# Patient Record
Sex: Male | Born: 1950
Health system: Southern US, Community
[De-identification: ages and names within clinical notes are randomized; demographics above are authoritative.]

---

## 2019-06-23 DIAGNOSIS — Z20828 Contact with and (suspected) exposure to other viral communicable diseases: Secondary | ICD-10-CM | POA: Diagnosis not present

## 2019-09-27 ENCOUNTER — Other Ambulatory Visit: Payer: Self-pay

## 2019-09-27 ENCOUNTER — Encounter (HOSPITAL_COMMUNITY): Payer: Self-pay

## 2019-09-27 ENCOUNTER — Emergency Department (HOSPITAL_COMMUNITY): Payer: Federal, State, Local not specified - PPO

## 2019-09-27 ENCOUNTER — Emergency Department (HOSPITAL_BASED_OUTPATIENT_CLINIC_OR_DEPARTMENT_OTHER)
Admit: 2019-09-27 | Discharge: 2019-09-27 | Disposition: A | Discharge status: Home / Self Care | Payer: Federal, State, Local not specified - PPO | Attending: Emergency Medicine | Admitting: Emergency Medicine

## 2019-09-27 ENCOUNTER — Inpatient Hospital Stay (HOSPITAL_COMMUNITY)
Admission: EM | Admit: 2019-09-27 | Discharge: 2019-10-02 | DRG: 252 | Disposition: A | Discharge status: Home / Self Care | Payer: Federal, State, Local not specified - PPO | Attending: Family Medicine | Admitting: Family Medicine

## 2019-09-27 ENCOUNTER — Inpatient Hospital Stay (HOSPITAL_COMMUNITY): Payer: Federal, State, Local not specified - PPO

## 2019-09-27 DIAGNOSIS — I82401 Acute embolism and thrombosis of unspecified deep veins of right lower extremity: Secondary | ICD-10-CM

## 2019-09-27 DIAGNOSIS — E871 Hypo-osmolality and hyponatremia: Secondary | ICD-10-CM | POA: Diagnosis not present

## 2019-09-27 DIAGNOSIS — I82421 Acute embolism and thrombosis of right iliac vein: Principal | ICD-10-CM | POA: Diagnosis present

## 2019-09-27 DIAGNOSIS — Z20822 Contact with and (suspected) exposure to covid-19: Secondary | ICD-10-CM | POA: Diagnosis not present

## 2019-09-27 DIAGNOSIS — M7989 Other specified soft tissue disorders: Secondary | ICD-10-CM

## 2019-09-27 DIAGNOSIS — D696 Thrombocytopenia, unspecified: Secondary | ICD-10-CM | POA: Diagnosis not present

## 2019-09-27 DIAGNOSIS — R0602 Shortness of breath: Secondary | ICD-10-CM | POA: Diagnosis not present

## 2019-09-27 DIAGNOSIS — I82411 Acute embolism and thrombosis of right femoral vein: Secondary | ICD-10-CM | POA: Diagnosis not present

## 2019-09-27 DIAGNOSIS — I10 Essential (primary) hypertension: Secondary | ICD-10-CM | POA: Diagnosis present

## 2019-09-27 DIAGNOSIS — I2694 Multiple subsegmental pulmonary emboli without acute cor pulmonale: Secondary | ICD-10-CM | POA: Diagnosis not present

## 2019-09-27 DIAGNOSIS — R17 Unspecified jaundice: Secondary | ICD-10-CM | POA: Diagnosis not present

## 2019-09-27 DIAGNOSIS — I824Y1 Acute embolism and thrombosis of unspecified deep veins of right proximal lower extremity: Secondary | ICD-10-CM

## 2019-09-27 DIAGNOSIS — Z79899 Other long term (current) drug therapy: Secondary | ICD-10-CM | POA: Diagnosis not present

## 2019-09-27 DIAGNOSIS — I2699 Other pulmonary embolism without acute cor pulmonale: Secondary | ICD-10-CM | POA: Diagnosis not present

## 2019-09-27 DIAGNOSIS — I82409 Acute embolism and thrombosis of unspecified deep veins of unspecified lower extremity: Secondary | ICD-10-CM

## 2019-09-27 DIAGNOSIS — N179 Acute kidney failure, unspecified: Secondary | ICD-10-CM

## 2019-09-27 LAB — TROPONIN I (HIGH SENSITIVITY)
Troponin I (High Sensitivity): 5 ng/L (ref <18)
Troponin I (High Sensitivity): 4 ng/L (ref <18)

## 2019-09-27 LAB — CBC WITH DIFFERENTIAL/PLATELET
Abs Immature Granulocytes: 0.03 10*3/uL (ref 0.00–0.07)
Basophils Absolute: 0.1 10*3/uL (ref 0.0–0.1)
Basophils Relative: 1 %
Eosinophils Absolute: 0.2 10*3/uL (ref 0.0–0.5)
Eosinophils Relative: 2 %
HCT: 48.2 % (ref 39.0–52.0)
Hemoglobin: 16 g/dL (ref 13.0–17.0)
Immature Granulocytes: 0 %
Lymphocytes Relative: 14 %
Lymphs Abs: 1.1 10*3/uL (ref 0.7–4.0)
MCH: 32.2 pg (ref 26.0–34.0)
MCHC: 33.2 g/dL (ref 30.0–36.0)
MCV: 97 fL (ref 80.0–100.0)
Monocytes Absolute: 0.5 10*3/uL (ref 0.1–1.0)
Monocytes Relative: 7 %
Neutro Abs: 6 10*3/uL (ref 1.7–7.7)
Neutrophils Relative %: 76 %
Platelets: 108 10*3/uL — ABNORMAL LOW (ref 150–400)
RBC: 4.97 MIL/uL (ref 4.22–5.81)
RDW: 12.6 % (ref 11.5–15.5)
WBC: 7.9 10*3/uL (ref 4.0–10.5)
nRBC: 0 % (ref 0.0–0.2)

## 2019-09-27 LAB — COMPREHENSIVE METABOLIC PANEL
ALT: 39 U/L (ref 0–44)
AST: 36 U/L (ref 15–41)
Albumin: 4.5 g/dL (ref 3.5–5.0)
Alkaline Phosphatase: 68 U/L (ref 38–126)
Anion gap: 12 (ref 5–15)
BUN: 30 mg/dL — ABNORMAL HIGH (ref 8–23)
CO2: 21 mmol/L — ABNORMAL LOW (ref 22–32)
Calcium: 9.4 mg/dL (ref 8.9–10.3)
Chloride: 101 mmol/L (ref 98–111)
Creatinine, Ser: 1.42 mg/dL — ABNORMAL HIGH (ref 0.61–1.24)
GFR calc Af Amer: 58 mL/min — ABNORMAL LOW (ref >60)
GFR calc non Af Amer: 50 mL/min — ABNORMAL LOW (ref >60)
Glucose, Bld: 136 mg/dL — ABNORMAL HIGH (ref 70–99)
Potassium: 4.6 mmol/L (ref 3.5–5.1)
Sodium: 134 mmol/L — ABNORMAL LOW (ref 135–145)
Total Bilirubin: 1.8 mg/dL — ABNORMAL HIGH (ref 0.3–1.2)
Total Protein: 8.2 g/dL — ABNORMAL HIGH (ref 6.5–8.1)

## 2019-09-27 LAB — RESPIRATORY PANEL BY RT PCR (FLU A&B, COVID)
Influenza A by PCR: NEGATIVE
Influenza B by PCR: NEGATIVE
SARS Coronavirus 2 by RT PCR: NEGATIVE

## 2019-09-27 LAB — HEPARIN LEVEL (UNFRACTIONATED): Heparin Unfractionated: 0.81 IU/mL — ABNORMAL HIGH (ref 0.30–0.70)

## 2019-09-27 LAB — BRAIN NATRIURETIC PEPTIDE: B Natriuretic Peptide: 28.3 pg/mL (ref 0.0–100.0)

## 2019-09-27 LAB — HIV ANTIBODY (ROUTINE TESTING W REFLEX): HIV Screen 4th Generation wRfx: NONREACTIVE

## 2019-09-27 MED ORDER — ONDANSETRON HCL 4 MG/2ML IJ SOLN: 4.0000 mg | Freq: Four times a day (QID) | INTRAMUSCULAR | Status: DC | PRN

## 2019-09-27 MED ORDER — HEPARIN (PORCINE) 25000 UT/250ML-% IV SOLN
1300.0000 [IU]/h | INTRAVENOUS | Status: AC
  Administered 2019-09-27: 13:00:00 1300 [IU]/h via INTRAVENOUS
  Administered 2019-09-28 – 2019-09-29 (×2): 1000 [IU]/h via INTRAVENOUS
  Administered 2019-09-30: 1200 [IU]/h via INTRAVENOUS
  Administered 2019-10-01: 1300 [IU]/h via INTRAVENOUS
  Administered 2019-10-01: 1200 [IU]/h via INTRAVENOUS
  Filled 2019-09-27 (×6): qty 250

## 2019-09-27 MED ORDER — HEPARIN BOLUS VIA INFUSION
5000.0000 [IU] | Freq: Once | INTRAVENOUS | Status: AC
  Administered 2019-09-27: 5000 [IU] via INTRAVENOUS
  Filled 2019-09-27: qty 5000

## 2019-09-27 MED ORDER — IOHEXOL 350 MG/ML SOLN
100.0000 mL | Freq: Once | INTRAVENOUS | Status: AC | PRN
  Administered 2019-09-27: 100 mL via INTRAVENOUS

## 2019-09-27 MED ORDER — ACETAMINOPHEN 325 MG PO TABS
650.0000 mg | ORAL_TABLET | Freq: Four times a day (QID) | ORAL | Status: DC | PRN
  Filled 2019-09-27: qty 2

## 2019-09-27 MED ORDER — SODIUM CHLORIDE (PF) 0.9 % IJ SOLN
INTRAMUSCULAR | Status: AC
  Filled 2019-09-27: qty 50

## 2019-09-27 MED ORDER — ONDANSETRON HCL 4 MG PO TABS: 4.0000 mg | ORAL_TABLET | Freq: Four times a day (QID) | ORAL | Status: DC | PRN

## 2019-09-27 MED ORDER — ACETAMINOPHEN 650 MG RE SUPP: 650.0000 mg | Freq: Four times a day (QID) | RECTAL | Status: DC | PRN

## 2019-09-27 NOTE — ED Triage Notes (Signed)
Patient c/o right leg swelling from thigh to foot since yesterday.

## 2019-09-27 NOTE — Progress Notes (Addendum)
Report received from ED RN. Pt going to room 1434.

## 2019-09-27 NOTE — H&P (Addendum)
History and Physical    Cristian Huang  KDX:[id number]  DOB: 05-09-1951  DOA: 09/27/2019 PCP: Patient, No Pcp Per  Patient coming from: home  Chief Complaint: right leg swelling  HPI: Cristian Huang is a 69 y.o. male with no medical history who states his right leg began to swell yesterday. He has some dyspnea with exertion and pain in the right leg when he walks. He went to urgent care today and was told to come to the ED. He has no other complaints. He is found to have a DVT and PE. He states he has been more sedantary due to COVID. No h/o blood clots in the family. No recent long trips. He does not smoke.  ED Course: extensive right leg DVT and b/l PR RR in 20-30s - BUN/Cr - 30/1.42 Na 134,  Platelets 108  Review of Systems:  All other systems reviewed and apart from HPI, are negative.  History reviewed. No pertinent past medical history.  History reviewed. No pertinent surgical history.  Social History:  He does not smoke but he drinks to drinks of hard liquor daily. He has 3 granddaughters living with him.   No Known Allergies  Family History  Problem Relation Age of Onset  . Cancer Father      Prior to Admission medications   Medication Sig Start Date End Date Taking? Authorizing Provider  ibuprofen (ADVIL) 200 MG tablet Take 600 mg by mouth every 6 (six) hours as needed for moderate pain.   Yes [provider]  lisinopril (ZESTRIL) 10 MG tablet Take 10 mg by mouth daily. 09/25/19   [provider]  memantine (NAMENDA) 5 MG tablet Take 5 mg by mouth 2 (two) times daily.  09/26/19   [provider]  sertraline (ZOLOFT) 100 MG tablet Take 100 mg by mouth daily. 09/25/19   [provider]  traZODone (DESYREL) 50 MG tablet Take 50 mg by mouth at bedtime.  09/26/19   [provider]    Physical Exam: Wt Readings from Last 3 Encounters:  09/27/19 99.3 kg   Vitals:   09/27/19 1415 09/27/19 1430 09/27/19 1443 09/27/19  1445  BP: 122/88 122/87 122/87 120/80  Pulse: 73 78 78 79  Resp: (!) 28 (!) 28 (!) 31 (!) 26  Temp:      TempSrc:      SpO2: 98% 98% 100% 100%  Weight:      Height:          Constitutional:  Calm & comfortable Eyes: PERRLA, lids and conjunctivae normal ENT:  Mucous membranes are moist.  Pharynx clear of exudate   Normal dentition.  Neck: Supple, no masses  Respiratory:  Clear to auscultation bilaterally - RR in high 20-30s Normal respiratory effort.  Cardiovascular:  S1 & S2 heard, regular rate and rhythm No Murmurs Abdomen:  Non distended No tenderness, No masses Bowel sounds normal Extremities:  No clubbing / cyanosis Severe right leg swelling with erythema and pain No joint deformity    Skin:  No rashes, lesions or ulcers Neurologic:  AAO x 3 CN 2-12 grossly intact Sensation intact Strength 5/5 in all 4 extremities Psychiatric:  Normal Mood and affect    Labs on Admission: I have personally reviewed following labs and imaging studies  CBC: Recent Labs  Lab 09/27/19 1126  WBC 7.9  NEUTROABS 6.0  HGB 16.0  HCT 48.2  MCV 97.0  PLT 108*   Basic Metabolic Panel: Recent Labs  Lab 09/27/19 1126  NA 134*  K 4.6  CL 101  CO2 21*  GLUCOSE 136*  BUN 30*  CREATININE 1.42*  CALCIUM 9.4   GFR: Estimated Creatinine Clearance: 57 mL/min (A) (by C-G formula based on SCr of 1.42 mg/dL (H)). Liver Function Tests: Recent Labs  Lab 09/27/19 1126  AST 36  ALT 39  ALKPHOS 68  BILITOT 1.8*  PROT 8.2*  ALBUMIN 4.5   No results for input(s): LIPASE, AMYLASE in the last 168 hours. No results for input(s): AMMONIA in the last 168 hours. Coagulation Profile: No results for input(s): INR, PROTIME in the last 168 hours. Cardiac Enzymes: No results for input(s): CKTOTAL, CKMB, CKMBINDEX, TROPONINI in the last 168 hours. BNP (last 3 results) No results for input(s): PROBNP in the last 8760 hours. HbA1C: No results for input(s): HGBA1C in the last 72  hours. CBG: No results for input(s): GLUCAP in the last 168 hours. Lipid Profile: No results for input(s): CHOL, HDL, LDLCALC, TRIG, CHOLHDL, LDLDIRECT in the last 72 hours. Thyroid Function Tests: No results for input(s): TSH, T4TOTAL, FREET4, T3FREE, THYROIDAB in the last 72 hours. Anemia Panel: No results for input(s): VITAMINB12, FOLATE, FERRITIN, TIBC, IRON, RETICCTPCT in the last 72 hours. Urine analysis: No results found for: COLORURINE, APPEARANCEUR, LABSPEC, PHURINE, GLUCOSEU, HGBUR, BILIRUBINUR, KETONESUR, PROTEINUR, UROBILINOGEN, NITRITE, LEUKOCYTESUR Sepsis Labs: @LABRCNTIP (procalcitonin:4,lacticidven:4) ) Recent Results (from the past 240 hour(s))  Respiratory Panel by RT PCR (Flu A&B, Covid) - Nasopharyngeal Swab     Status: None   Collection Time: 09/27/19  1:38 PM   Specimen: Nasopharyngeal Swab  Result Value Ref Range Status   SARS Coronavirus 2 by RT PCR NEGATIVE NEGATIVE Final    Comment: (NOTE) SARS-CoV-2 target nucleic acids are NOT DETECTED. The SARS-CoV-2 RNA is generally detectable in upper respiratoy specimens during the acute phase of infection. The lowest concentration of SARS-CoV-2 viral copies this assay can detect is 131 copies/mL. A negative result does not preclude SARS-Cov-2 infection and should not be used as the sole basis for treatment or other patient management decisions. A negative result may occur with  improper specimen collection/handling, submission of specimen other than nasopharyngeal swab, presence of viral mutation(s) within the areas targeted by this assay, and inadequate number of viral copies (<131 copies/mL). A negative result must be combined with clinical observations, patient history, and epidemiological information. The expected result is Negative. Fact Sheet for Patients:  PinkCheek.be Fact Sheet for Healthcare Providers:  GravelBags.it This test is not yet ap proved  or cleared by the [place] FDA and  has been authorized for detection and/or diagnosis of SARS-CoV-2 by FDA under an Emergency Use Authorization (EUA). This EUA will remain  in effect (meaning this test can be used) for the duration of the COVID-19 declaration under Section 564(b)(1) of the Act, 21 U.S.C. section 360bbb-3(b)(1), unless the authorization is terminated or revoked sooner.    Influenza A by PCR NEGATIVE NEGATIVE Final   Influenza B by PCR NEGATIVE NEGATIVE Final    Comment: (NOTE) The Xpert Xpress SARS-CoV-2/FLU/RSV assay is intended as an aid in  the diagnosis of influenza from Nasopharyngeal swab specimens and  should not be used as a sole basis for treatment. Nasal washings and  aspirates are unacceptable for Xpert Xpress SARS-CoV-2/FLU/RSV  testing. Fact Sheet for Patients: PinkCheek.be Fact Sheet for Healthcare Providers: GravelBags.it This test is not yet approved or cleared by the [place] FDA and  has been authorized for detection and/or diagnosis of SARS-CoV-2 by  FDA under an  Emergency Use Authorization (EUA). This EUA will remain  in effect (meaning this test can be used) for the duration of the  Covid-19 declaration under Section 564(b)(1) of the Act, 21  U.S.C. section 360bbb-3(b)(1), unless the authorization is  terminated or revoked. Performed at [facility], [address], [place], [place] [address]      Radiological Exams on Admission: CT Angio Chest PE W and/or Wo Contrast  Result Date: 09/27/2019 CLINICAL DATA:  New history of DVT with exertional shortness of breath EXAM: CT ANGIOGRAPHY CHEST WITH CONTRAST TECHNIQUE: Multidetector CT imaging of the chest was performed using the standard protocol during bolus administration of intravenous contrast. Multiplanar CT image reconstructions and MIPs were obtained to evaluate the vascular anatomy. CONTRAST:   OMNIPAQUE IOHEXOL 350 MG/ML SOLN COMPARISON:  None. FINDINGS: Cardiovascular: Thoracic aorta demonstrates a normal branching pattern. Scattered atherosclerotic calcifications are noted. No aneurysmal dilatation or dissection is seen. No cardiac enlargement is seen. Mild coronary calcifications are seen. The pulmonary artery shows a normal branching pattern. New bilateral lower lobe pulmonary emboli are seen slightly greater on the right than the left. Right upper lobe pulmonary emboli are noted as well. No right heart strain is seen. Mediastinum/Nodes: Thoracic inlet is within normal limits. No sizable hilar or mediastinal adenopathy is noted. The esophagus as visualized is within normal limits. Lungs/Pleura: Lungs are well aerated bilaterally. Some dependent atelectatic changes are seen. No findings to suggest pulmonary infarct are noted at this time. No pleural effusion is seen. No sizable parenchymal nodules are noted. Upper Abdomen: Visualized upper abdomen shows no acute abnormality. Musculoskeletal: Mild degenerative change of the thoracic spine is seen. No acute bony abnormality is noted. Review of the MIP images confirms the above findings. IMPRESSION: New bilateral pulmonary emboli as described without right heart strain. Mild dependent atelectatic changes are seen. Aortic Atherosclerosis (ICD10-I70.0). These results were called by telephone at the time of interpretation on 09/27/2019 at 1:22 pm to provider [facility] , who verbally acknowledged these results. Electronically Signed   By: Alcide Clever M.D.   On: 09/27/2019 13:24   VAS [place] LOWER EXTREMITY VENOUS (DVT) (ONLY [facility] & [facility])  Result Date: 09/27/2019  Lower Venous DVTStudy Indications: Swelling.  Risk Factors: None identified. Limitations: Poor ultrasound/tissue interface and bowel gas. Comparison Study: No prior studies. Performing Technologist: Chanda Busing RVT  Examination Guidelines: A complete evaluation includes B-mode imaging,  spectral Doppler, color Doppler, and power Doppler as needed of all accessible portions of each vessel. Bilateral testing is considered an integral part of a complete examination. Limited examinations for reoccurring indications may be performed as noted. The reflux portion of the exam is performed with the patient in reverse Trendelenburg.  +---------+---------------+---------+-----------+----------+--------------+ RIGHT    CompressibilityPhasicitySpontaneityPropertiesThrombus Aging +---------+---------------+---------+-----------+----------+--------------+ CFV      None           No       No                   Acute          +---------+---------------+---------+-----------+----------+--------------+ FV Prox  None           No       No                   Acute          +---------+---------------+---------+-----------+----------+--------------+ FV Mid   None           No  No                   Acute          +---------+---------------+---------+-----------+----------+--------------+ FV DistalNone           No       No                   Acute          +---------+---------------+---------+-----------+----------+--------------+ PFV      None                                         Acute          +---------+---------------+---------+-----------+----------+--------------+ POP      Full           Yes      Yes                                 +---------+---------------+---------+-----------+----------+--------------+ PTV      Full                                                        +---------+---------------+---------+-----------+----------+--------------+ PERO                                                  Not visualized +---------+---------------+---------+-----------+----------+--------------+ Gastroc  Full                                                        +---------+---------------+---------+-----------+----------+--------------+ EIV       None           No       No                   Acute          +---------+---------------+---------+-----------+----------+--------------+ CIV                                                   Not visualized +---------+---------------+---------+-----------+----------+--------------+ The distal IVC appears patent.  +----+---------------+---------+-----------+----------+--------------+ LEFTCompressibilityPhasicitySpontaneityPropertiesThrombus Aging +----+---------------+---------+-----------+----------+--------------+ CFV Full           Yes      Yes                                 +----+---------------+---------+-----------+----------+--------------+     Summary: RIGHT: - Findings consistent with acute deep vein thrombosis involving the right external iliac vein, common femoral vein, right femoral vein, and right proximal profunda vein. - No cystic structure found in the popliteal fossa.  LEFT: - No evidence of common femoral vein obstruction.  *See table(s) above for measurements and observations. Electronically signed  by Waverly Ferrari MD on 09/27/2019 at 1:07:17 PM.    Final        Assessment/Plan Principal Problem:   Right leg DVT  - extensive DVT up to external iliac vein   Pulmonary emboli- not hypoxic at rest but short of breath with exertion - has been started on Heparin infusion - his right leg is quite swollen, red and painful - have asked for IR eval for thrombolysis   Thrombocytopenia - platelets 108- likely due to consumption  AKI/ hyponatremia - start slow IV hydration  Hyperbilirubinemia - follow- other LFTs are normal  Of note: his med rec has multiple meds on home med list but states he does not take any medications and does not recall have any medications prescribed to him.    DVT prophylaxis: Heparin  Code Status: Full code  Family Communication:   Disposition Plan: from home  Consults called: IR  Admission status: inpatient    Cristian Cantor  MD [facility] Pager: [email or web address] Password [email or web address] [phone], please contact night-coverage   09/27/2019, 4:08 PM

## 2019-09-27 NOTE — ED Notes (Signed)
Called main lab and confirmed they had gold tunes to run admission orders with.

## 2019-09-27 NOTE — Progress Notes (Signed)
Referring Physician(s): Dr. Karlyne Greenspan  Supervising Physician: Malachy Moan  Patient Status:  [facility] - In-pt  Chief Complaint: Right leg swelling - DVT  Subjective: 69 y.o, male inpatient alert and laying in bed, calm and comfortable. Presented to this facility with sudden onset of right leg swelling with [facility] upon exertion X 1 day. Team is requesting evaluation for possible DVT lysis.  Patient denies smoking,  history of DVT, recent illness (including COVID) or recent  long trips. Patient states he has a grandon who has had DVT's. Patient received the first covid vaccine several weeks ago    Allergies: Patient has no known allergies.  Medications: Prior to Admission medications   Medication Sig Start Date End Date Taking? Authorizing Provider  ibuprofen (ADVIL) 200 MG tablet Take 600 mg by mouth every 6 (six) hours as needed for moderate pain.   Yes [provider]  lisinopril (ZESTRIL) 10 MG tablet Take 10 mg by mouth daily. 09/25/19   [provider]  memantine (NAMENDA) 5 MG tablet Take 5 mg by mouth 2 (two) times daily.  09/26/19   [provider]  sertraline (ZOLOFT) 100 MG tablet Take 100 mg by mouth daily. 09/25/19   [provider]  traZODone (DESYREL) 50 MG tablet Take 50 mg by mouth at bedtime.  09/26/19   [provider]     Vital Signs: BP (!) 144/90 (BP Location: Right Arm)    Pulse 78    Temp 99.1 F (37.3 C) (Oral)    Resp 20    Ht 5\' 9"  (1.753 m)    Wt 219 lb (99.3 kg)    SpO2 100%    BMI 32.34 kg/m   Physical Exam Vitals and nursing note reviewed.  Constitutional:      Appearance: He is well-developed.  HENT:     Head: Normocephalic.  Cardiovascular:     Rate and Rhythm: Normal rate and regular rhythm.     Pulses: Normal pulses.     Comments: Swelling to right leg. Right calf approximately 10 cm larger than left.   Popiteal dorsalis pedis and posterior tibial pulses intact.  Pulmonary:     Effort:  Pulmonary effort is normal.  Musculoskeletal:        General: Swelling ( whole leg) present. Normal range of motion.     Cervical back: Normal range of motion.  Skin:    General: Skin is dry.     Findings: Erythema ( right leg ) present.  Neurological:     Mental Status: He is alert and oriented to person, place, and time.  Psychiatric:        Mood and Affect: Mood normal.        Behavior: Behavior normal.        Thought Content: Thought content normal.     Imaging: CT Angio Chest PE W and/or Wo Contrast  Result Date: 09/27/2019 CLINICAL DATA:  New history of DVT with exertional shortness of breath EXAM: CT ANGIOGRAPHY CHEST WITH CONTRAST TECHNIQUE: Multidetector CT imaging of the chest was performed using the standard protocol during bolus administration of intravenous contrast. Multiplanar CT image reconstructions and MIPs were obtained to evaluate the vascular anatomy. CONTRAST:  09/29/2019 OMNIPAQUE IOHEXOL 350 MG/ML SOLN COMPARISON:  None. FINDINGS: Cardiovascular: Thoracic aorta demonstrates a normal branching pattern. Scattered atherosclerotic calcifications are noted. No aneurysmal dilatation or dissection is seen. No cardiac enlargement is seen. Mild coronary calcifications are seen. The pulmonary artery shows a normal branching pattern.  New bilateral lower lobe pulmonary emboli are seen slightly greater on the right than the left. Right upper lobe pulmonary emboli are noted as well. No right heart strain is seen. Mediastinum/Nodes: Thoracic inlet is within normal limits. No sizable hilar or mediastinal adenopathy is noted. The esophagus as visualized is within normal limits. Lungs/Pleura: Lungs are well aerated bilaterally. Some dependent atelectatic changes are seen. No findings to suggest pulmonary infarct are noted at this time. No pleural effusion is seen. No sizable parenchymal nodules are noted. Upper Abdomen: Visualized upper abdomen shows no acute abnormality. Musculoskeletal: Mild  degenerative change of the thoracic spine is seen. No acute bony abnormality is noted. Review of the MIP images confirms the above findings. IMPRESSION: New bilateral pulmonary emboli as described without right heart strain. Mild dependent atelectatic changes are seen. Aortic Atherosclerosis (ICD10-I70.0). These results were called by telephone at the time of interpretation on 09/27/2019 at 1:22 pm to provider [facility] , who verbally acknowledged these results. Electronically Signed   By: Alcide Clever M.D.   On: 09/27/2019 13:24   VAS [place] LOWER EXTREMITY VENOUS (DVT) (ONLY [facility] & [facility])  Result Date: 09/27/2019  Lower Venous DVTStudy Indications: Swelling.  Risk Factors: None identified. Limitations: Poor ultrasound/tissue interface and bowel gas. Comparison Study: No prior studies. Performing Technologist: Chanda Busing RVT  Examination Guidelines: A complete evaluation includes B-mode imaging, spectral Doppler, color Doppler, and power Doppler as needed of all accessible portions of each vessel. Bilateral testing is considered an integral part of a complete examination. Limited examinations for reoccurring indications may be performed as noted. The reflux portion of the exam is performed with the patient in reverse Trendelenburg.  +---------+---------------+---------+-----------+----------+--------------+  RIGHT     Compressibility Phasicity Spontaneity Properties Thrombus Aging  +---------+---------------+---------+-----------+----------+--------------+  CFV       None            No        No                     Acute           +---------+---------------+---------+-----------+----------+--------------+  FV Prox   None            No        No                     Acute           +---------+---------------+---------+-----------+----------+--------------+  FV Mid    None            No        No                     Acute           +---------+---------------+---------+-----------+----------+--------------+   FV Distal None            No        No                     Acute           +---------+---------------+---------+-----------+----------+--------------+  PFV       None                                             Acute           +---------+---------------+---------+-----------+----------+--------------+  POP       Full            Yes       Yes                                    +---------+---------------+---------+-----------+----------+--------------+  PTV       Full                                                             +---------+---------------+---------+-----------+----------+--------------+  PERO                                                       Not visualized  +---------+---------------+---------+-----------+----------+--------------+  Gastroc   Full                                                             +---------+---------------+---------+-----------+----------+--------------+  EIV       None            No        No                     Acute           +---------+---------------+---------+-----------+----------+--------------+  CIV                                                        Not visualized  +---------+---------------+---------+-----------+----------+--------------+ The distal IVC appears patent.  +----+---------------+---------+-----------+----------+--------------+  LEFT Compressibility Phasicity Spontaneity Properties Thrombus Aging  +----+---------------+---------+-----------+----------+--------------+  CFV  Full            Yes       Yes                                    +----+---------------+---------+-----------+----------+--------------+     Summary: RIGHT: - Findings consistent with acute deep vein thrombosis involving the right external iliac vein, common femoral vein, right femoral vein, and right proximal profunda vein. - No cystic structure found in the popliteal fossa.  LEFT: - No evidence of common femoral vein obstruction.  *See table(s) above for measurements and  observations. Electronically signed by Deitra Mayo MD on 09/27/2019 at 1:07:17 PM.    Final     Labs:  CBC: Recent Labs    09/27/19 1126  WBC 7.9  HGB 16.0  HCT 48.2  PLT 108*    COAGS: No results for input(s): INR, APTT in the last 8760 hours.  BMP: Recent Labs    09/27/19 1126  NA 134*  K 4.6  CL 101  CO2 21*  GLUCOSE 136*  BUN 30*  CALCIUM 9.4  CREATININE 1.42*  GFRNONAA 50*  GFRAA 58*    LIVER FUNCTION TESTS: Recent Labs    09/27/19 1126  BILITOT 1.8*  AST 36  ALT 39  ALKPHOS 68  PROT 8.2*  ALBUMIN 4.5    Assessment and Plan:  69 y.o, male inpatient. Presented to this facility with sudden onset of right leg swelling with [facility] upon exertion X 1 day. Team is requesting evaluation for possible DVT lysis.  Patient denies smoking,  history of DVT, recent illness (including COVID) or recent  long trips. Patient states he has a grandon who has had DVT's. Patient received the first covid vaccine several weeks ago.   Pertinent Imaging 3.23.21 - Venous doppler reads Findings consistent with acute deep vein thrombosis involving the right external iliac vein, common femoral vein, right femoral vein, and right proximal profunda vein. 3.23.21 - CTA chest reads New bilateral pulmonary emboli as described without right heart strain.  Pertinent IR History none  Pertinent Allergies NKDA  Cr 1.42, BUN 30 All labs are within acceptable parameters.  Patient is afebrile. Patient is currently on a heparin gtt  After review with IR Attending Dr. Archer Asa plan is to obtain a CT venogram for further evaluation of   Iliofemoral extension. Based upon those findings  Patient may need to be transferred to [facility] for possible DVT lysis. This was discussed directly with the patient who is in agreement with the plan of care.    Electronically Signed: Marletta Lor, NP 09/27/2019, 5:57 PM

## 2019-09-27 NOTE — Progress Notes (Signed)
ANTICOAGULATION CONSULT NOTE  Pharmacy Consult for IV heparin Indication: DVT  No Known Allergies  Patient Measurements: Height: 5\' 9"  (175.3 cm) Weight: 219 lb (99.3 kg) IBW/kg (Calculated) : 70.7 Heparin Dosing Weight: 79.3 kg   Vital Signs: Temp: 99.1 F (37.3 C) (03/23 1748) Temp Source: Oral (03/23 1748) BP: 144/90 (03/23 1748) Pulse Rate: 78 (03/23 1748)  Labs: Recent Labs    09/27/19 1126 09/27/19 1347 09/27/19 1836  HGB 16.0  --   --   HCT 48.2  --   --   PLT 108*  --   --   HEPARINUNFRC  --   --  0.81*  CREATININE 1.42*  --   --   TROPONINIHS 5 4  --     Estimated Creatinine Clearance: 57 mL/min (A) (by C-G formula based on SCr of 1.42 mg/dL (H)).   Medical History: History reviewed. No pertinent past medical history.  Medications:  Scheduled:  . sodium chloride (PF)        Assessment: Pharmacy is consulted to dose heparin in 69 yo male diagnosed with DVT. 78 of lower right extremity shows findings consistent with acute deep vein thrombosis involving the right external iliac vein, common femoral vein, right femoral vein, and right proximal profunda vein. CT of chest also shows new bilateral pulmonary emboli as described without right heart strain.No anticoagulants on med rec.   Today, 09/27/19   First heparin level elevated on 1300 units/hr; level drawn from same arm but upstream of heparin infusion  No bleeding or infusion issues per RN  Hgb 16.0, Hct 48.2  SCr 14.2 mg/dl, CrCl 57 ml/min  Goal of Therapy:  Heparin level 0.3-0.7 units/ml Monitor platelets by anticoagulation protocol: Yes   Plan:   Reduce heparin IV to 1000 units/hr  Recheck heparin level in 6 hrs  Daily CBC while on heparin  Monitor for signs and symptoms of bleeding  09/29/19, PharmD, BCPS [phone] 09/27/2019, 9:33 PM

## 2019-09-27 NOTE — Progress Notes (Signed)
ANTICOAGULATION CONSULT NOTE - Initial Consult  Pharmacy Consult for IV heparin Indication: DVT  No Known Allergies  Patient Measurements: Height: 5\' 9"  (175.3 cm) Weight: 219 lb (99.3 kg) IBW/kg (Calculated) : 70.7 Heparin Dosing Weight: 79.3 kg   Vital Signs: Temp: 97.3 F (36.3 C) (03/23 1028) Temp Source: Oral (03/23 1028) BP: 133/93 (03/23 1230) Pulse Rate: 73 (03/23 1230)  Labs: Recent Labs    09/27/19 1126  HGB 16.0  HCT 48.2  PLT 108*  CREATININE 1.42*  TROPONINIHS 5    Estimated Creatinine Clearance: 57 mL/min (A) (by C-G formula based on SCr of 1.42 mg/dL (H)).   Medical History: History reviewed. No pertinent past medical history.  Medications:  Scheduled:  . sodium chloride (PF)        Assessment: Pharmacy is consulted to dose heparin in 69 yo male diagnosed with DVT. 78 of lower right extremity shows findings consistent with acute deep vein thrombosis involving the right external iliac vein, common femoral vein, right femoral vein, and right proximal profunda vein. CT of chest also shows new bilateral pulmonary emboli as described without right heart strain.No anticoagulants on med rec.   Today, 09/27/19  Hgb 16.0, Hct 48.2  SCr 14.2 mg/dl, CrCl 57 ml/min   Goal of Therapy:  Heparin level 0.3-0.7 units/ml Monitor platelets by anticoagulation protocol: Yes   Plan:   Heparin 5000 unit bolus followed by heparin 1300 units/hr  Daily CBC while on heparin  HL 6 hours after start of infusion  Monitor for signs and symptoms of bleeding   09/29/19, PharmD, BCPS 09/27/2019 1:30 PM

## 2019-09-27 NOTE — Progress Notes (Signed)
Right lower extremity venous duplex has been completed. Preliminary results can be found in CV Proc through chart review.  Results were given to Dr. Rush Landmark.  09/27/19 11:49 AM Olen Cordial RVT

## 2019-09-27 NOTE — ED Provider Notes (Signed)
[facility]-EMERGENCY DEPT Provider Note   CSN: [id number] Arrival date & time: 09/27/19  1021     History Chief Complaint  Patient presents with  . Leg Swelling    Cristian Huang is a 69 y.o. male.  The history is provided by the patient and medical records. No language interpreter was used.  Leg Pain Location:  Leg Time since incident:  2 days Injury: no   Leg location:  R lower leg, R upper leg and R leg Pain details:    Quality:  Aching   Radiates to:  Does not radiate   Severity:  Severe   Onset quality:  Gradual   Duration:  2 days   Timing:  Constant   Progression:  Worsening Chronicity:  New Dislocation: no   Tetanus status:  Unknown Prior injury to area:  No Relieved by:  Rest Worsened by:  Activity Ineffective treatments:  None tried Associated symptoms: swelling   Associated symptoms: no back pain, no decreased ROM, no fatigue, no fever, no itching, no muscle weakness, no neck pain, no numbness, no stiffness and no tingling        History reviewed. No pertinent past medical history.  There are no problems to display for this patient.   History reviewed. No pertinent surgical history.     Family History  Problem Relation Age of Onset  . Cancer Father     Social History   Tobacco Use  . Smoking status: Never Smoker  . Smokeless tobacco: Never Used  Substance Use Topics  . Alcohol use: Yes  . Drug use: Never    Home Medications Prior to Admission medications   Not on File    Allergies    Patient has no known allergies.  Review of Systems   Review of Systems  Constitutional: Negative for chills, diaphoresis, fatigue and fever.  HENT: Negative for congestion.   Eyes: Negative for visual disturbance.  Respiratory: Positive for shortness of breath. Negative for cough, chest tightness and wheezing.   Cardiovascular: Positive for leg swelling. Negative for chest pain and palpitations.  Gastrointestinal:  Negative for abdominal pain, constipation, diarrhea, nausea and vomiting.  Genitourinary: Negative for dysuria, flank pain and frequency.  Musculoskeletal: Negative for back pain, neck pain, neck stiffness and stiffness.  Skin: Positive for color change. Negative for itching, rash and wound.  Neurological: Negative for light-headedness and headaches.  Psychiatric/Behavioral: Negative for agitation.  All other systems reviewed and are negative.   Physical Exam Updated Vital Signs BP 112/81 (BP Location: Right Arm)   Pulse (!) 107   Temp (!) 97.3 F (36.3 C) (Oral)   Resp 20   Ht 5\' 9"  (1.753 m)   Wt 99.3 kg   SpO2 100%   BMI 32.34 kg/m   Physical Exam Vitals and nursing note reviewed.  Constitutional:      General: He is not in acute distress.    Appearance: He is well-developed and normal weight. He is not ill-appearing, toxic-appearing or diaphoretic.  HENT:     Head: Normocephalic and atraumatic.     Nose: Nose normal. No congestion or rhinorrhea.     Mouth/Throat:     Mouth: Mucous membranes are moist.     Pharynx: No oropharyngeal exudate or posterior oropharyngeal erythema.  Eyes:     Extraocular Movements: Extraocular movements intact.     Conjunctiva/sclera: Conjunctivae normal.     Pupils: Pupils are equal, round, and reactive to light.  Cardiovascular:  Rate and Rhythm: Regular rhythm. Tachycardia present.     Pulses: Normal pulses.     Heart sounds: No murmur.  Pulmonary:     Effort: Pulmonary effort is normal. No respiratory distress.     Breath sounds: Normal breath sounds. No wheezing, rhonchi or rales.  Chest:     Chest wall: No tenderness.  Abdominal:     General: Abdomen is flat. There is no distension.     Palpations: Abdomen is soft.     Tenderness: There is no abdominal tenderness. There is no right CVA tenderness, left CVA tenderness, guarding or rebound.  Musculoskeletal:        General: Swelling and tenderness present. No deformity or signs  of injury.     Cervical back: Neck supple. No tenderness.     Right lower leg: Edema present.     Left lower leg: No edema.  Skin:    General: Skin is warm and dry.     Capillary Refill: Capillary refill takes less than 2 seconds.     Findings: Erythema present. No rash.  Neurological:     General: No focal deficit present.     Mental Status: He is alert.  Psychiatric:        Mood and Affect: Mood normal.     ED Results / Procedures / Treatments   Labs (all labs ordered are listed, but only abnormal results are displayed) Labs Reviewed  CBC WITH DIFFERENTIAL/PLATELET - Abnormal; Notable for the following components:      Result Value   Platelets 108 (*)    All other components within normal limits  COMPREHENSIVE METABOLIC PANEL - Abnormal; Notable for the following components:   Sodium 134 (*)    CO2 21 (*)    Glucose, Bld 136 (*)    BUN 30 (*)    Creatinine, Ser 1.42 (*)    Total Protein 8.2 (*)    Total Bilirubin 1.8 (*)    GFR calc non Af Amer 50 (*)    GFR calc Af Amer 58 (*)    All other components within normal limits  RESPIRATORY PANEL BY RT PCR (FLU A&B, COVID)  BRAIN NATRIURETIC PEPTIDE  HEPARIN LEVEL (UNFRACTIONATED)  HIV ANTIBODY (ROUTINE TESTING W REFLEX)  CBC  BASIC METABOLIC PANEL  TROPONIN I (HIGH SENSITIVITY)  TROPONIN I (HIGH SENSITIVITY)    EKG None  Radiology CT Angio Chest PE W and/or Wo Contrast  Result Date: 09/27/2019 CLINICAL DATA:  New history of DVT with exertional shortness of breath EXAM: CT ANGIOGRAPHY CHEST WITH CONTRAST TECHNIQUE: Multidetector CT imaging of the chest was performed using the standard protocol during bolus administration of intravenous contrast. Multiplanar CT image reconstructions and MIPs were obtained to evaluate the vascular anatomy. CONTRAST:  OMNIPAQUE IOHEXOL 350 MG/ML SOLN COMPARISON:  None. FINDINGS: Cardiovascular: Thoracic aorta demonstrates a normal branching pattern. Scattered atherosclerotic  calcifications are noted. No aneurysmal dilatation or dissection is seen. No cardiac enlargement is seen. Mild coronary calcifications are seen. The pulmonary artery shows a normal branching pattern. New bilateral lower lobe pulmonary emboli are seen slightly greater on the right than the left. Right upper lobe pulmonary emboli are noted as well. No right heart strain is seen. Mediastinum/Nodes: Thoracic inlet is within normal limits. No sizable hilar or mediastinal adenopathy is noted. The esophagus as visualized is within normal limits. Lungs/Pleura: Lungs are well aerated bilaterally. Some dependent atelectatic changes are seen. No findings to suggest pulmonary infarct are noted at this time. No  pleural effusion is seen. No sizable parenchymal nodules are noted. Upper Abdomen: Visualized upper abdomen shows no acute abnormality. Musculoskeletal: Mild degenerative change of the thoracic spine is seen. No acute bony abnormality is noted. Review of the MIP images confirms the above findings. IMPRESSION: New bilateral pulmonary emboli as described without right heart strain. Mild dependent atelectatic changes are seen. Aortic Atherosclerosis (ICD10-I70.0). These results were called by telephone at the time of interpretation on 09/27/2019 at 1:22 pm to provider [facility] , who verbally acknowledged these results. Electronically Signed   By: Alcide Clever M.D.   On: 09/27/2019 13:24   VAS [place] LOWER EXTREMITY VENOUS (DVT) (ONLY [facility] & [facility])  Result Date: 09/27/2019  Lower Venous DVTStudy Indications: Swelling.  Risk Factors: None identified. Limitations: Poor ultrasound/tissue interface and bowel gas. Comparison Study: No prior studies. Performing Technologist: Chanda Busing RVT  Examination Guidelines: A complete evaluation includes B-mode imaging, spectral Doppler, color Doppler, and power Doppler as needed of all accessible portions of each vessel. Bilateral testing is considered an integral part of a  complete examination. Limited examinations for reoccurring indications may be performed as noted. The reflux portion of the exam is performed with the patient in reverse Trendelenburg.  +---------+---------------+---------+-----------+----------+--------------+ RIGHT    CompressibilityPhasicitySpontaneityPropertiesThrombus Aging +---------+---------------+---------+-----------+----------+--------------+ CFV      None           No       No                   Acute          +---------+---------------+---------+-----------+----------+--------------+ FV Prox  None           No       No                   Acute          +---------+---------------+---------+-----------+----------+--------------+ FV Mid   None           No       No                   Acute          +---------+---------------+---------+-----------+----------+--------------+ FV DistalNone           No       No                   Acute          +---------+---------------+---------+-----------+----------+--------------+ PFV      None                                         Acute          +---------+---------------+---------+-----------+----------+--------------+ POP      Full           Yes      Yes                                 +---------+---------------+---------+-----------+----------+--------------+ PTV      Full                                                        +---------+---------------+---------+-----------+----------+--------------+  PERO                                                  Not visualized +---------+---------------+---------+-----------+----------+--------------+ Gastroc  Full                                                        +---------+---------------+---------+-----------+----------+--------------+ EIV      None           No       No                   Acute          +---------+---------------+---------+-----------+----------+--------------+ CIV                                                    Not visualized +---------+---------------+---------+-----------+----------+--------------+ The distal IVC appears patent.  +----+---------------+---------+-----------+----------+--------------+ LEFTCompressibilityPhasicitySpontaneityPropertiesThrombus Aging +----+---------------+---------+-----------+----------+--------------+ CFV Full           Yes      Yes                                 +----+---------------+---------+-----------+----------+--------------+     Summary: RIGHT: - Findings consistent with acute deep vein thrombosis involving the right external iliac vein, common femoral vein, right femoral vein, and right proximal profunda vein. - No cystic structure found in the popliteal fossa.  LEFT: - No evidence of common femoral vein obstruction.  *See table(s) above for measurements and observations. Electronically signed by Waverly Ferrari MD on 09/27/2019 at 1:07:17 PM.    Final     Procedures Procedures (including critical care time)  CRITICAL CARE Performed by: Canary Brim Kahdijah Errickson Total critical care time: 35 minutes Critical care time was exclusive of separately billable procedures and treating other patients. Critical care was necessary to treat or prevent imminent or life-threatening deterioration. Critical care was time spent personally by me on the following activities: development of treatment plan with patient and/or surrogate as well as nursing, discussions with consultants, evaluation of patient's response to treatment, examination of patient, obtaining history from patient or surrogate, ordering and performing treatments and interventions, ordering and review of laboratory studies, ordering and review of radiographic studies, pulse oximetry and re-evaluation of patient's condition.   Medications Ordered in ED Medications  heparin ADULT infusion 100 units/mL (25000 units/282mL sodium chloride 0.45%) (1,300 Units/hr  Intravenous New Bag/Given 09/27/19 1233)  sodium chloride (PF) 0.9 % injection (has no administration in time range)  heparin bolus via infusion 5,000 Units (5,000 Units Intravenous Bolus from Bag 09/27/19 1233)  iohexol (OMNIPAQUE) 350 MG/ML injection 100 mL (100 mLs Intravenous Contrast Given 09/27/19 1259)    ED Course  I have reviewed the triage vital signs and the nursing notes.  Pertinent labs & imaging results that were available during my care of the patient were reviewed by me and considered in my medical decision making (see chart for details).    MDM Rules/Calculators/A&P  Cristian Huang is a 69 y.o. male with no significant past medical history who presents with right leg pain and swelling.  He reports that yesterday morning after going to [facility], he noticed he was having pain and swelling of his right leg.  He also reports that the pain is worsened when he ambulates.  He reports he is had some exertional shortness of breath but no chest pain with it.  All the symptoms are new.  No history of DVT or PE.  No history of aortic or arterial vascular problems.  He denies any abdominal pain or chest pain.  No pain in his back.  He denies recent fevers, chills, congestion, or cough.  No recent Covid symptoms.  Denies recent medication use or medication changes.  He describes his pain as severe when he is trying to walk around or at times when it is hurting worse.  He reports that the leg is swollen and looking more red than the left.  He denies numbness or tingling.  On exam, patient has diffuse pain and swelling in the right leg.  He has decreased pulses on palpation in the DP and PT arteries.  A hand-held Doppler was utilized and he did have pulses in the DP and PT arteries on the right leg.  He had normal sensation and strength in the leg.  I could palpate a pulse in his right groin.  Abdomen nontender.  Lungs clear and chest is nontender.  Flank and back  nontender.  Clinically I am most concerned about a large DVT with PE versus an arterial etiology of his symptoms. 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 Will get ultrasound first to determine if he has large DVT .  If he does, will get a PE study versus if there is no clot, he may need a CT with runoffs to look for arterial clot or abnormality.  Will get screening labs as well.  Will get rapid Covid in case needs go to the OR for procedures.  Anticipate reassessment for work-up.  He does not want pain medicine at this time.        11:49 AM Vascular test reports that patient has extensive DVT in the right leg up to the iliac arteries.  We will get the PE study and started on heparin.  Given the concern for developing phlegmasia, regardless of if he has a PE or not, I think he will need admission for further management of his large DVT.  PE study was completed showing bilateral pulmonary emboli.  He is still having some shortness of breath but is on room air.  Due to the proximal DVT with concern for developing mild phlegmasia in his bilateral PE, patient was started on heparin and will be admitted for further monitoring and management.   Final Clinical Impression(s) / ED Diagnoses Final diagnoses:  Leg swelling  Acute deep vein thrombosis (DVT) of proximal vein of right lower extremity (HCC)  Bilateral pulmonary embolism (HCC)  Exertional shortness of breath    Rx / DC Orders ED Discharge Orders    None      Clinical Impression: 1. Leg swelling   2. Acute deep vein thrombosis (DVT) of proximal vein of right lower extremity (HCC)   3. Bilateral pulmonary embolism ([place])   4. Exertional shortness of breath     Disposition: Admit  This note was prepared with assistance of Dragon voice recognition software. Occasional wrong-word or sound-a-like substitutions may have occurred due to the inherent limitations  of voice recognition software.     Vernelle Wisner, Canary Brimhristopher J, MD 09/27/19  [phone]

## 2019-09-28 LAB — BASIC METABOLIC PANEL
Anion gap: 10 (ref 5–15)
BUN: 26 mg/dL — ABNORMAL HIGH (ref 8–23)
CO2: 21 mmol/L — ABNORMAL LOW (ref 22–32)
Calcium: 8.8 mg/dL — ABNORMAL LOW (ref 8.9–10.3)
Chloride: 103 mmol/L (ref 98–111)
Creatinine, Ser: 1.29 mg/dL — ABNORMAL HIGH (ref 0.61–1.24)
GFR calc Af Amer: >60 mL/min (ref >60)
GFR calc non Af Amer: 56 mL/min — ABNORMAL LOW (ref >60)
Glucose, Bld: 131 mg/dL — ABNORMAL HIGH (ref 70–99)
Potassium: 3.6 mmol/L (ref 3.5–5.1)
Sodium: 134 mmol/L — ABNORMAL LOW (ref 135–145)

## 2019-09-28 LAB — HEPARIN LEVEL (UNFRACTIONATED)
Heparin Unfractionated: 0.42 IU/mL (ref 0.30–0.70)
Heparin Unfractionated: 0.38 IU/mL (ref 0.30–0.70)

## 2019-09-28 LAB — CBC
HCT: 42.7 % (ref 39.0–52.0)
Hemoglobin: 14.4 g/dL (ref 13.0–17.0)
MCH: 32 pg (ref 26.0–34.0)
MCHC: 33.7 g/dL (ref 30.0–36.0)
MCV: 94.9 fL (ref 80.0–100.0)
Platelets: 122 10*3/uL — ABNORMAL LOW (ref 150–400)
RBC: 4.5 MIL/uL (ref 4.22–5.81)
RDW: 12.7 % (ref 11.5–15.5)
WBC: 7 10*3/uL (ref 4.0–10.5)
nRBC: 0 % (ref 0.0–0.2)

## 2019-09-28 NOTE — Consult Note (Signed)
Chief Complaint: Right leg swelling   Referring Physician(s): Dr. Salena Saner Tegeler  Supervising Physician: Gilmer Mor  Patient Status: [facility] - In-pt  History of Present Illness: Cristian Huang is a 69 y.o. male Presented to this facility with sudden onset of right leg swelling and SHOB upon exertion x 1 day. Team is requesting evaluaiton for possible DVT lysis.  Patient denies smoking, history of DVT's recent illness (including COVID), long trips. Patient does endorse a grandson who has had DVT"s. Patient has received the first COVID vaccine several weeks ago.  History reviewed. No pertinent past medical history.  History reviewed. No pertinent surgical history.  Allergies: Patient has no known allergies.  Medications: Prior to Admission medications   Medication Sig Start Date End Date Taking? Authorizing Provider  ibuprofen (ADVIL) 200 MG tablet Take 600 mg by mouth every 6 (six) hours as needed for moderate pain.   Yes [provider]  lisinopril (ZESTRIL) 10 MG tablet Take 10 mg by mouth daily. 09/25/19   [provider]  memantine (NAMENDA) 5 MG tablet Take 5 mg by mouth 2 (two) times daily.  09/26/19   [provider]  sertraline (ZOLOFT) 100 MG tablet Take 100 mg by mouth daily. 09/25/19   [provider]  traZODone (DESYREL) 50 MG tablet Take 50 mg by mouth at bedtime.  09/26/19   [provider]     Family History  Problem Relation Age of Onset  . Cancer Father     Social History   Socioeconomic History  . Marital status: Single    Spouse name: Not on file  . Number of children: Not on file  . Years of education: Not on file  . Highest education level: Not on file  Occupational History  . Not on file  Tobacco Use  . Smoking status: Never Smoker  . Smokeless tobacco: Never Used  Substance and Sexual Activity  . Alcohol use: Yes  . Drug use: Never  . Sexual activity: Not on file  Other Topics Concern  . Not  on file  Social History Narrative  . Not on file   Social Determinants of Health   Financial Resource Strain:   . Difficulty of Paying Living Expenses:   Food Insecurity:   . Worried About Programme researcher, broadcasting/film/video in the Last Year:   . Barista in the Last Year:   Transportation Needs:   . Freight forwarder (Medical):   Marland Kitchen Lack of Transportation (Non-Medical):   Physical Activity:   . Days of Exercise per Week:   . Minutes of Exercise per Session:   Stress:   . Feeling of Stress :   Social Connections:   . Frequency of Communication with Friends and Family:   . Frequency of Social Gatherings with Friends and Family:   . Attends Religious Services:   . Active Member of Clubs or Organizations:   . Attends Banker Meetings:   Marland Kitchen Marital Status:     Review of Systems: A 12 point ROS discussed and pertinent positives are indicated in the HPI above.  All other systems are negative.  Review of Systems  Constitutional: Negative for fever.  HENT: Negative for congestion.   Respiratory: Negative for cough and shortness of breath.   Cardiovascular: Negative for chest pain.  Gastrointestinal: Negative for abdominal pain.  Musculoskeletal:       Right leg swelling. Patient denies pain at this time.  Neurological: Negative for headaches.  Psychiatric/Behavioral: Negative for behavioral problems and confusion.    Vital Signs: BP 134/82 (BP Location: Right Arm)   Pulse 70   Temp 98 F (36.7 C) (Oral)   Resp 18   Ht 5\' 9"  (1.753 m)   Wt 219 lb (99.3 kg)   SpO2 95%   BMI 32.34 kg/m   Physical Exam Vitals and nursing note reviewed.  Constitutional:      Appearance: He is well-developed.  HENT:     Head: Normocephalic.  Cardiovascular:     Rate and Rhythm: Normal rate and regular rhythm.     Heart sounds: Normal heart sounds.     Comments: Swelling to right leg. Improved since 3.24.21 but still present Right calf remains  larger than left.   Popiteal  dorsalis pedis and posterior tibial pulses intact Pulmonary:     Effort: Pulmonary effort is normal.     Breath sounds: Normal breath sounds.  Musculoskeletal:        General: Swelling ( entire right leg) present. Normal range of motion.     Cervical back: Normal range of motion.  Skin:    General: Skin is dry.     Findings: Erythema present.  Neurological:     Mental Status: He is alert and oriented to person, place, and time.  Psychiatric:        Mood and Affect: Mood normal.        Behavior: Behavior normal.        Thought Content: Thought content normal.     Imaging: CT Angio Chest PE W and/or Wo Contrast  Result Date: 09/27/2019 CLINICAL DATA:  New history of DVT with exertional shortness of breath EXAM: CT ANGIOGRAPHY CHEST WITH CONTRAST TECHNIQUE: Multidetector CT imaging of the chest was performed using the standard protocol during bolus administration of intravenous contrast. Multiplanar CT image reconstructions and MIPs were obtained to evaluate the vascular anatomy. CONTRAST:  137mL OMNIPAQUE IOHEXOL 350 MG/ML SOLN COMPARISON:  None. FINDINGS: Cardiovascular: Thoracic aorta demonstrates a normal branching pattern. Scattered atherosclerotic calcifications are noted. No aneurysmal dilatation or dissection is seen. No cardiac enlargement is seen. Mild coronary calcifications are seen. The pulmonary artery shows a normal branching pattern. New bilateral lower lobe pulmonary emboli are seen slightly greater on the right than the left. Right upper lobe pulmonary emboli are noted as well. No right heart strain is seen. Mediastinum/Nodes: Thoracic inlet is within normal limits. No sizable hilar or mediastinal adenopathy is noted. The esophagus as visualized is within normal limits. Lungs/Pleura: Lungs are well aerated bilaterally. Some dependent atelectatic changes are seen. No findings to suggest pulmonary infarct are noted at this time. No pleural effusion is seen. No sizable parenchymal  nodules are noted. Upper Abdomen: Visualized upper abdomen shows no acute abnormality. Musculoskeletal: Mild degenerative change of the thoracic spine is seen. No acute bony abnormality is noted. Review of the MIP images confirms the above findings. IMPRESSION: New bilateral pulmonary emboli as described without right heart strain. Mild dependent atelectatic changes are seen. Aortic Atherosclerosis (ICD10-I70.0). These results were called by telephone at the time of interpretation on 09/27/2019 at 1:22 pm to provider [facility] , who verbally acknowledged these results. Electronically Signed   By: Inez Catalina M.D.   On: 09/27/2019 13:24   CT VENOGRAM ABD/PEL  Result Date: 09/27/2019 CLINICAL DATA:  Right lower extremity DVT. Pulmonary embolus. Shortness breath EXAM: CT ABDOMEN AND PELVIS WITH CONTRAST TECHNIQUE: Multidetector CT imaging of the abdomen and pelvis was performed using the  standard protocol following bolus administration of intravenous contrast. Also, delayed images were obtained to assess the venous system. CONTRAST:  OMNIPAQUE IOHEXOL 350 MG/ML SOLN COMPARISON:  CT a chest from 08/30/2019 FINDINGS: Lower chest: Stable mild atelectasis in the lung bases. Right lower lobe pulmonary embolus is shown on image 1/2. Hepatobiliary: Unremarkable Pancreas: Unremarkable Spleen: Unremarkable Adrenals/Urinary Tract: The adrenal glands appear normal. No urothelial filling defect. No significant abnormal renal enhancement. Stomach/Bowel: Unremarkable Vascular/Lymphatic: Aortoiliac atherosclerotic vascular disease. No pathologic adenopathy is identified. On venous phase images we demonstrate considerable deep vein thrombosis at involving the right external iliac vein and common femoral vein. There is no left-sided pelvic DVT. No thrombus immediately in the IVC. I do not observe thrombus in the right internal iliac vein. Reproductive: Unremarkable Other: No supplemental non-categorized findings.  Musculoskeletal: There stranding along fascia planes in the right upper thigh region anteriorly. Congenitally short pedicles in the lumbar spine with prominence of the epidural adipose tissue. There is thought to be degenerative disc disease at L4-5 likely causing considerable central narrowing of the thecal sac. Left foraminal impingement suspected at L4-5 at L5-S1. IMPRESSION: 1. Acute right lower lobe pulmonary embolus (as shown on recent CT chest). 2. Deep vein thrombosis filling the right external iliac vein and common femoral vein. No definite involvement of the common femoral vein or IVC. Edema stranding along fascia planes anteriorly in the right upper thighs likely related to the DVT. 3. Lower lumbar spondylosis and degenerative disc disease with suspected impingement at L4-5 and L5-S1. 4. Aortoiliac atherosclerotic vascular disease. Aortic Atherosclerosis (ICD10-I70.0). Electronically Signed   By: Gaylyn Rong M.D.   On: 09/27/2019 20:32   VAS [place] LOWER EXTREMITY VENOUS (DVT) (ONLY [facility] & [facility])  Result Date: 09/27/2019  Lower Venous DVTStudy Indications: Swelling.  Risk Factors: None identified. Limitations: Poor ultrasound/tissue interface and bowel gas. Comparison Study: No prior studies. Performing Technologist: Chanda Busing RVT  Examination Guidelines: A complete evaluation includes B-mode imaging, spectral Doppler, color Doppler, and power Doppler as needed of all accessible portions of each vessel. Bilateral testing is considered an integral part of a complete examination. Limited examinations for reoccurring indications may be performed as noted. The reflux portion of the exam is performed with the patient in reverse Trendelenburg.  +---------+---------------+---------+-----------+----------+--------------+ RIGHT    CompressibilityPhasicitySpontaneityPropertiesThrombus Aging +---------+---------------+---------+-----------+----------+--------------+ CFV      None           No        No                   Acute          +---------+---------------+---------+-----------+----------+--------------+ FV Prox  None           No       No                   Acute          +---------+---------------+---------+-----------+----------+--------------+ FV Mid   None           No       No                   Acute          +---------+---------------+---------+-----------+----------+--------------+ FV DistalNone           No       No                   Acute          +---------+---------------+---------+-----------+----------+--------------+  PFV      None                                         Acute          +---------+---------------+---------+-----------+----------+--------------+ POP      Full           Yes      Yes                                 +---------+---------------+---------+-----------+----------+--------------+ PTV      Full                                                        +---------+---------------+---------+-----------+----------+--------------+ PERO                                                  Not visualized +---------+---------------+---------+-----------+----------+--------------+ Gastroc  Full                                                        +---------+---------------+---------+-----------+----------+--------------+ EIV      None           No       No                   Acute          +---------+---------------+---------+-----------+----------+--------------+ CIV                                                   Not visualized +---------+---------------+---------+-----------+----------+--------------+ The distal IVC appears patent.  +----+---------------+---------+-----------+----------+--------------+ LEFTCompressibilityPhasicitySpontaneityPropertiesThrombus Aging +----+---------------+---------+-----------+----------+--------------+ CFV Full           Yes      Yes                                  +----+---------------+---------+-----------+----------+--------------+     Summary: RIGHT: - Findings consistent with acute deep vein thrombosis involving the right external iliac vein, common femoral vein, right femoral vein, and right proximal profunda vein. - No cystic structure found in the popliteal fossa.  LEFT: - No evidence of common femoral vein obstruction.  *See table(s) above for measurements and observations. Electronically signed by Waverly Ferrarihristopher Dickson MD on 09/27/2019 at 1:07:17 PM.    Final     Labs:  CBC: Recent Labs    09/27/19 1126 09/28/19 0249  WBC 7.9 7.0  HGB 16.0 14.4  HCT 48.2 42.7  PLT 108* 122*    COAGS: No results for input(s): INR, APTT in the last 8760 hours.  BMP: Recent Labs    09/27/19 1126 09/28/19 0249  NA 134* 134*  K 4.6 3.6  CL 101 103  CO2 21* 21*  GLUCOSE 136* 131*  BUN 30* 26*  CALCIUM 9.4 8.8*  CREATININE 1.42* 1.29*  GFRNONAA 50* 56*  GFRAA 58* >60    LIVER FUNCTION TESTS: Recent Labs    09/27/19 1126  BILITOT 1.8*  AST 36  ALT 39  ALKPHOS 68  PROT 8.2*  ALBUMIN 4.5    TUMOR MARKERS: No results for input(s): AFPTM, CEA, CA199, CHROMGRNA in the last 8760 hours.  Assessment and Plan:  69 y.o, male inpatient. Presented to this facility with sudden onset of right leg swelling and SHOB upon exertion x 1 day. Team is requesting evaluation for possible DVT lysis.  Patient denies smoking, history of DVT's recent illness (including COVID), long trips. Patient does endorse a grandson who has had DVT"s. Patient has received the first COVID vaccine several weeks ago.   Pertinent Imaging 3.23.21 -Ct venogram reads On venous phase images we demonstrate considerable deep vein thrombosis at involving the right external iliac vein and common femoral vein 3.23.21 - Venous doppler reads Findings consistent with acute deep vein thrombosis involving the right external iliac vein, common femoral vein, right femoral vein, and right  proximal profunda vein. 3.23.21 - CTA chest reads New bilateral pulmonary emboli as described without right heart strain.    Pertinent IR History none  Pertinent Allergies NKDA  Cr 1.29, BUN 26  All labs are within acceptable parameters.  Patient is afebrile. Patient is on a heparin gtt  IR consulted for possible DVT lysis. Case has been reviewed and procedure approved by Dr. Loreta Ave.  Patient tentatively scheduled for venogram with possible intervention at [facility] on 3.25.21.  Team instructed to:  Facilitate transfer to [facility] Patient to be NPO after midnight   IR will call patient when ready.  Risks and benefits discussed with the patient including, but not limited to bleeding, possible life threatening bleeding and need for blood product transfusion, vascular injury, stroke, contrast induced renal failure, limb loss and infection.  All of the patient's questions were answered, patient is agreeable to proceed. Consent signed and in chart.   Thank you for this interesting consult.  I greatly enjoyed meeting Yonas Bunda and look forward to participating in their care.  A copy of this report was sent to the requesting provider on this date.  Electronically Signed: Marletta Lor, NP 09/28/2019, 3:38 PM   I spent a total of 40 Minutes    in face to face in clinical consultation, greater than 50% of which was counseling/coordinating care for Venogram with DVT lysis

## 2019-09-28 NOTE — Progress Notes (Addendum)
ANTICOAGULATION CONSULT NOTE  Pharmacy Consult for IV heparin Indication: DVT  No Known Allergies  Patient Measurements: Height: 5\' 9"  (175.3 cm) Weight: 219 lb (99.3 kg) IBW/kg (Calculated) : 70.7 Heparin Dosing Weight: 79.3 kg   Vital Signs: Temp: 98.4 F (36.9 C) (03/24 0434) Temp Source: Oral (03/24 0434) BP: 116/86 (03/24 0434) Pulse Rate: 67 (03/24 0434)  Labs: Recent Labs    09/27/19 1126 09/27/19 1347 09/27/19 1836 09/28/19 0249  HGB 16.0  --   --  14.4  HCT 48.2  --   --  42.7  PLT 108*  --   --  122*  HEPARINUNFRC  --   --  0.81* 0.42  CREATININE 1.42*  --   --  1.29*  TROPONINIHS 5 4  --   --    Estimated Creatinine Clearance: 62.8 mL/min (A) (by C-G formula based on SCr of 1.29 mg/dL (H)).  Medical History: History reviewed. No pertinent past medical history.  Medications:  Scheduled:   Assessment: Pharmacy is consulted to dose heparin in 69 yo male diagnosed with DVT. 78 of lower right extremity shows findings consistent with acute deep vein thrombosis involving the right external iliac vein, common femoral vein, right femoral vein, and right proximal profunda vein. CT of chest also shows new bilateral pulmonary emboli as described without right heart strain.No anticoagulants on med rec.   Baseline Hgb 16, Plt 108  Heparin 5000 unit bolus, infusion at 1300 units/hr  3/23: 1st heparin level (0.81) elevated on 1300 units/hr; level drawn from same arm but upstream of heparin infusion, rate reduced to 1000 units/hr  Today, 09/28/19   0300 Hep level 0.42 on 1000 units/hr  No bleeding or infusion issues per RN  Hgb 14.4 (sl decr), Plt 122 (sl incr)  Goal of Therapy:  Heparin level 0.3-0.7 units/ml Monitor platelets by anticoagulation protocol: Yes   Plan:   Continue Heparin at 1000 units/hr  Recheck heparin level at 1100 am, will addend note with any changes  Daily CBC while on heparin, daily Heparin level at steady state  Monitor for  signs and symptoms of bleeding  Await decision po anti-coag  09/30/19 PharmD 09/28/2019, 6:54 AM   __________________________________ Confirmatory Heparin level at 10:37 this am = 0.38 units/ml  Continue Heparin at 1000 units/hr Daily Hep level starting 3/25  4/25 PharmD 09/28/2019, 11:38 AM

## 2019-09-28 NOTE — Progress Notes (Signed)
PROGRESS NOTE    Cristian Huang  LGX:[id number] DOB: 1950/09/11 DOA: 09/27/2019 PCP: Patient, No Pcp Per   Brief Narrative: 69 year old with no significant past medical history who presents complaining of right lower extremity edema.  He also reports some dyspnea with exertion and pain of his right leg on ambulation.  Patient was found to have DVT and PE.  Due to extension of the DVT IR was consulted for further evaluation and treatment for DVT.    Assessment & Plan:   Principal Problem:   Right leg DVT (HCC) Active Problems:   Pulmonary emboli (HCC)   AKI (acute kidney injury) (HCC)   Thrombocytopenia (HCC)  1-Right  lower extremity DVT, extensive DVT up to the external iliac vein Pulmonary embolism.  Vitals stable -Continue with heparin drip. -IR consulted and recommending patient to be transferred to [facility] for thrombolysis or thrombectomy.  2-Thrombocytopenia; in the setting of acute DVT Improved.  3-AKI/hyponatremia: Improved.  4-Hyperbilirubinemia; repeat labs in the morning     Estimated body mass index is 32.34 kg/m as calculated from the following:   Height as of this encounter: 5\' 9"  (1.753 m).   Weight as of this encounter: 99.3 kg.   DVT prophylaxis: Heparin drip Code Status: Full code Family Communication: Care discussed with patient directly Disposition Plan:  Patient is from: Home Anticipated d/c date: 2 or 3 days Barriers to d/c or necessity for inpatient status: Patient with acute DVT of right lower extremity requiring IV heparin and plan to transfer to [facility] for thrombolysis or thrombectomy for right lower extremity DVT.   Consultants:   IR  Procedures:   Doppler lower extremities:Findings consistent with acute deep vein thrombosis involving the right  external iliac vein, common femoral vein, right femoral vein, and right  proximal profunda vein.  - No cystic structure found in the popliteal fossa.     Antimicrobials:      Subjective: Patient denies chest pain or shortness of breath.  He report persistent cough right lower extremity edema with mild improvement  Objective: Vitals:   09/27/19 1748 09/27/19 2118 09/27/19 2341 09/28/19 0434  BP: (!) 144/90 (!) 137/91 120/87 116/86  Pulse: 78 90 79 67  Resp: 20 20  18   Temp: 99.1 F (37.3 C) 99.5 F (37.5 C) 99.3 F (37.4 C) 98.4 F (36.9 C)  TempSrc: Oral Oral Oral Oral  SpO2: 100% 99% 98% 97%  Weight:      Height:        Intake/Output Summary (Last 24 hours) at 09/28/2019 1022 Last data filed at 09/28/2019 0517 Gross per 24 hour  Intake 209.02 ml  Output 475 ml  Net -265.98 ml   Filed Weights   09/27/19 1029  Weight: 99.3 kg    Examination:  General exam: Appears calm and comfortable  Respiratory system: Clear to auscultation. Respiratory effort normal. Cardiovascular system: S1 & S2 heard, RRR. No JVD, murmurs, rubs, gallops or clicks. No pedal edema. Gastrointestinal system: Abdomen is nondistended, soft and nontender. No organomegaly or masses felt. Normal bowel sounds heard. Central nervous system: Alert and oriented. No focal neurological deficits. Extremities: Symmetric 5 x 5 power.  Right lower extremity with significant edema compared to the left Skin: No rashes, lesions or ulcers   Data Reviewed: I have personally reviewed following labs and imaging studies  CBC: Recent Labs  Lab 09/27/19 1126 09/28/19 0249  WBC 7.9 7.0  NEUTROABS 6.0  --   HGB 16.0 14.4  HCT 48.2 42.7  MCV 97.0 94.9  PLT 108* 122*   Basic Metabolic Panel: Recent Labs  Lab 09/27/19 1126 09/28/19 0249  NA 134* 134*  K 4.6 3.6  CL 101 103  CO2 21* 21*  GLUCOSE 136* 131*  BUN 30* 26*  CREATININE 1.42* 1.29*  CALCIUM 9.4 8.8*   GFR: Estimated Creatinine Clearance: 62.8 mL/min (A) (by C-G formula based on SCr of 1.29 mg/dL (H)). Liver Function Tests: Recent Labs  Lab 09/27/19 1126  AST 36  ALT 39  ALKPHOS 68  BILITOT 1.8*  PROT 8.2*   ALBUMIN 4.5   No results for input(s): LIPASE, AMYLASE in the last 168 hours. No results for input(s): AMMONIA in the last 168 hours. Coagulation Profile: No results for input(s): INR, PROTIME in the last 168 hours. Cardiac Enzymes: No results for input(s): CKTOTAL, CKMB, CKMBINDEX, TROPONINI in the last 168 hours. BNP (last 3 results) No results for input(s): PROBNP in the last 8760 hours. HbA1C: No results for input(s): HGBA1C in the last 72 hours. CBG: No results for input(s): GLUCAP in the last 168 hours. Lipid Profile: No results for input(s): CHOL, HDL, LDLCALC, TRIG, CHOLHDL, LDLDIRECT in the last 72 hours. Thyroid Function Tests: No results for input(s): TSH, T4TOTAL, FREET4, T3FREE, THYROIDAB in the last 72 hours. Anemia Panel: No results for input(s): VITAMINB12, FOLATE, FERRITIN, TIBC, IRON, RETICCTPCT in the last 72 hours. Sepsis Labs: No results for input(s): PROCALCITON, LATICACIDVEN in the last 168 hours.  Recent Results (from the past 240 hour(s))  Respiratory Panel by RT PCR (Flu A&B, Covid) - Nasopharyngeal Swab     Status: None   Collection Time: 09/27/19  1:38 PM   Specimen: Nasopharyngeal Swab  Result Value Ref Range Status   SARS Coronavirus 2 by RT PCR NEGATIVE NEGATIVE Final    Comment: (NOTE) SARS-CoV-2 target nucleic acids are NOT DETECTED. The SARS-CoV-2 RNA is generally detectable in upper respiratoy specimens during the acute phase of infection. The lowest concentration of SARS-CoV-2 viral copies this assay can detect is 131 copies/mL. A negative result does not preclude SARS-Cov-2 infection and should not be used as the sole basis for treatment or other patient management decisions. A negative result may occur with  improper specimen collection/handling, submission of specimen other than nasopharyngeal swab, presence of viral mutation(s) within the areas targeted by this assay, and inadequate number of viral copies (<131 copies/mL). A negative  result must be combined with clinical observations, patient history, and epidemiological information. The expected result is Negative. Fact Sheet for Patients:  https://www.moore.com/ Fact Sheet for Healthcare Providers:  https://www.young.biz/ This test is not yet ap proved or cleared by the [place] FDA and  has been authorized for detection and/or diagnosis of SARS-CoV-2 by FDA under an Emergency Use Authorization (EUA). This EUA will remain  in effect (meaning this test can be used) for the duration of the COVID-19 declaration under Section 564(b)(1) of the Act, 21 U.S.C. section 360bbb-3(b)(1), unless the authorization is terminated or revoked sooner.    Influenza A by PCR NEGATIVE NEGATIVE Final   Influenza B by PCR NEGATIVE NEGATIVE Final    Comment: (NOTE) The Xpert Xpress SARS-CoV-2/FLU/RSV assay is intended as an aid in  the diagnosis of influenza from Nasopharyngeal swab specimens and  should not be used as a sole basis for treatment. Nasal washings and  aspirates are unacceptable for Xpert Xpress SARS-CoV-2/FLU/RSV  testing. Fact Sheet for Patients: https://www.moore.com/ Fact Sheet for Healthcare Providers: https://www.young.biz/ This test is not yet approved or cleared  by the [place] and  has been authorized for detection and/or diagnosis of SARS-CoV-2 by  FDA under an Emergency Use Authorization (EUA). This EUA will remain  in effect (meaning this test can be used) for the duration of the  Covid-19 declaration under Section 564(b)(1) of the Act, 21  U.S.C. section 360bbb-3(b)(1), unless the authorization is  terminated or revoked. Performed at [facility], [place] [address] [place], [place] [address]          Radiology Studies: CT Angio Chest PE W and/or Wo Contrast  Result Date: 09/27/2019 CLINICAL DATA:  New history of DVT with exertional  shortness of breath EXAM: CT ANGIOGRAPHY CHEST WITH CONTRAST TECHNIQUE: Multidetector CT imaging of the chest was performed using the standard protocol during bolus administration of intravenous contrast. Multiplanar CT image reconstructions and MIPs were obtained to evaluate the vascular anatomy. CONTRAST:  146mL OMNIPAQUE IOHEXOL 350 MG/ML SOLN COMPARISON:  None. FINDINGS: Cardiovascular: Thoracic aorta demonstrates a normal branching pattern. Scattered atherosclerotic calcifications are noted. No aneurysmal dilatation or dissection is seen. No cardiac enlargement is seen. Mild coronary calcifications are seen. The pulmonary artery shows a normal branching pattern. New bilateral lower lobe pulmonary emboli are seen slightly greater on the right than the left. Right upper lobe pulmonary emboli are noted as well. No right heart strain is seen. Mediastinum/Nodes: Thoracic inlet is within normal limits. No sizable hilar or mediastinal adenopathy is noted. The esophagus as visualized is within normal limits. Lungs/Pleura: Lungs are well aerated bilaterally. Some dependent atelectatic changes are seen. No findings to suggest pulmonary infarct are noted at this time. No pleural effusion is seen. No sizable parenchymal nodules are noted. Upper Abdomen: Visualized upper abdomen shows no acute abnormality. Musculoskeletal: Mild degenerative change of the thoracic spine is seen. No acute bony abnormality is noted. Review of the MIP images confirms the above findings. IMPRESSION: New bilateral pulmonary emboli as described without right heart strain. Mild dependent atelectatic changes are seen. Aortic Atherosclerosis (ICD10-I70.0). These results were called by telephone at the time of interpretation on 09/27/2019 at 1:22 pm to provider [facility] , who verbally acknowledged these results. Electronically Signed   By: Inez Catalina M.D.   On: 09/27/2019 13:24   CT VENOGRAM ABD/PEL  Result Date: 09/27/2019 CLINICAL  DATA:  Right lower extremity DVT. Pulmonary embolus. Shortness breath EXAM: CT ABDOMEN AND PELVIS WITH CONTRAST TECHNIQUE: Multidetector CT imaging of the abdomen and pelvis was performed using the standard protocol following bolus administration of intravenous contrast. Also, delayed images were obtained to assess the venous system. CONTRAST:  177mL OMNIPAQUE IOHEXOL 350 MG/ML SOLN COMPARISON:  CT a chest from 08/30/2019 FINDINGS: Lower chest: Stable mild atelectasis in the lung bases. Right lower lobe pulmonary embolus is shown on image 1/2. Hepatobiliary: Unremarkable Pancreas: Unremarkable Spleen: Unremarkable Adrenals/Urinary Tract: The adrenal glands appear normal. No urothelial filling defect. No significant abnormal renal enhancement. Stomach/Bowel: Unremarkable Vascular/Lymphatic: Aortoiliac atherosclerotic vascular disease. No pathologic adenopathy is identified. On venous phase images we demonstrate considerable deep vein thrombosis at involving the right external iliac vein and common femoral vein. There is no left-sided pelvic DVT. No thrombus immediately in the IVC. I do not observe thrombus in the right internal iliac vein. Reproductive: Unremarkable Other: No supplemental non-categorized findings. Musculoskeletal: There stranding along fascia planes in the right upper thigh region anteriorly. Congenitally short pedicles in the lumbar spine with prominence of the epidural adipose tissue. There is thought to be degenerative disc disease at L4-5  likely causing considerable central narrowing of the thecal sac. Left foraminal impingement suspected at L4-5 at L5-S1. IMPRESSION: 1. Acute right lower lobe pulmonary embolus (as shown on recent CT chest). 2. Deep vein thrombosis filling the right external iliac vein and common femoral vein. No definite involvement of the common femoral vein or IVC. Edema stranding along fascia planes anteriorly in the right upper thighs likely related to the DVT. 3. Lower  lumbar spondylosis and degenerative disc disease with suspected impingement at L4-5 and L5-S1. 4. Aortoiliac atherosclerotic vascular disease. Aortic Atherosclerosis (ICD10-I70.0). Electronically Signed   By: Gaylyn RongWalter  Liebkemann M.D.   On: 09/27/2019 20:32   VAS US LOWER EXTREMITY VENOUS (DVT) (ONLY [facility] & [facility])  Result Date: 09/27/2019  Lower Venous DVTStudy Indications: Swelling.  Risk Factors: None identified. Limitations: Poor ultrasound/tissue interface and bowel gas. Comparison Study: No prior studies. Performing Technologist: Chanda BusingGregory Collins RVT  Examination Guidelines: A complete evaluation includes B-mode imaging, spectral Doppler, color Doppler, and power Doppler as needed of all accessible portions of each vessel. Bilateral testing is considered an integral part of a complete examination. Limited examinations for reoccurring indications may be performed as noted. The reflux portion of the exam is performed with the patient in reverse Trendelenburg.  +---------+---------------+---------+-----------+----------+--------------+  RIGHT     Compressibility Phasicity Spontaneity Properties Thrombus Aging  +---------+---------------+---------+-----------+----------+--------------+  CFV       None            No        No                     Acute           +---------+---------------+---------+-----------+----------+--------------+  FV Prox   None            No        No                     Acute           +---------+---------------+---------+-----------+----------+--------------+  FV Mid    None            No        No                     Acute           +---------+---------------+---------+-----------+----------+--------------+  FV Distal None            No        No                     Acute           +---------+---------------+---------+-----------+----------+--------------+  PFV       None                                             Acute            +---------+---------------+---------+-----------+----------+--------------+  POP       Full            Yes       Yes                                    +---------+---------------+---------+-----------+----------+--------------+  PTV  Full                                                             +---------+---------------+---------+-----------+----------+--------------+  PERO                                                       Not visualized  +---------+---------------+---------+-----------+----------+--------------+  Gastroc   Full                                                             +---------+---------------+---------+-----------+----------+--------------+  EIV       None            No        No                     Acute           +---------+---------------+---------+-----------+----------+--------------+  CIV                                                        Not visualized  +---------+---------------+---------+-----------+----------+--------------+ The distal IVC appears patent.  +----+---------------+---------+-----------+----------+--------------+  LEFT Compressibility Phasicity Spontaneity Properties Thrombus Aging  +----+---------------+---------+-----------+----------+--------------+  CFV  Full            Yes       Yes                                    +----+---------------+---------+-----------+----------+--------------+     Summary: RIGHT: - Findings consistent with acute deep vein thrombosis involving the right external iliac vein, common femoral vein, right femoral vein, and right proximal profunda vein. - No cystic structure found in the popliteal fossa.  LEFT: - No evidence of common femoral vein obstruction.  *See table(s) above for measurements and observations. Electronically signed by Waverly Ferrari MD on 09/27/2019 at 1:07:17 PM.    Final         Scheduled Meds: Continuous Infusions:  heparin 1,000 Units/hr (09/28/19 0528)     LOS: 1 day    Time spent: 35  minutes    Malyk Girouard A Denyse Fillion, MD [facility]   If 7PM-7AM, please contact night-coverage [email or web address]  09/28/2019, 10:22 AM

## 2019-09-29 LAB — BASIC METABOLIC PANEL
Anion gap: 8 (ref 5–15)
BUN: 26 mg/dL — ABNORMAL HIGH (ref 8–23)
CO2: 22 mmol/L (ref 22–32)
Calcium: 8.7 mg/dL — ABNORMAL LOW (ref 8.9–10.3)
Chloride: 108 mmol/L (ref 98–111)
Creatinine, Ser: 1.09 mg/dL (ref 0.61–1.24)
GFR calc Af Amer: >60 mL/min (ref >60)
GFR calc non Af Amer: >60 mL/min (ref >60)
Glucose, Bld: 113 mg/dL — ABNORMAL HIGH (ref 70–99)
Potassium: 3.8 mmol/L (ref 3.5–5.1)
Sodium: 138 mmol/L (ref 135–145)

## 2019-09-29 LAB — HEPARIN LEVEL (UNFRACTIONATED)
Heparin Unfractionated: 0.17 IU/mL — ABNORMAL LOW (ref 0.30–0.70)
Heparin Unfractionated: 0.64 IU/mL (ref 0.30–0.70)

## 2019-09-29 LAB — CBC
HCT: 41.2 % (ref 39.0–52.0)
Hemoglobin: 13.8 g/dL (ref 13.0–17.0)
MCH: 31.9 pg (ref 26.0–34.0)
MCHC: 33.5 g/dL (ref 30.0–36.0)
MCV: 95.4 fL (ref 80.0–100.0)
Platelets: 139 10*3/uL — ABNORMAL LOW (ref 150–400)
RBC: 4.32 MIL/uL (ref 4.22–5.81)
RDW: 12.4 % (ref 11.5–15.5)
WBC: 6.2 10*3/uL (ref 4.0–10.5)
nRBC: 0 % (ref 0.0–0.2)

## 2019-09-29 LAB — MRSA PCR SCREENING: MRSA by PCR: NEGATIVE

## 2019-09-29 MED ORDER — HEPARIN BOLUS VIA INFUSION
2000.0000 [IU] | Freq: Once | INTRAVENOUS | Status: AC
  Administered 2019-09-29: 2000 [IU] via INTRAVENOUS
  Filled 2019-09-29: qty 2000

## 2019-09-29 MED ORDER — CHLORHEXIDINE GLUCONATE CLOTH 2 % EX PADS
6.0000 | MEDICATED_PAD | Freq: Every day | CUTANEOUS | Status: DC
  Administered 2019-09-29 – 2019-09-30 (×2): 6 via TOPICAL

## 2019-09-29 NOTE — Progress Notes (Signed)
ANTICOAGULATION CONSULT NOTE  Pharmacy Consult for IV heparin Indication: DVT  No Known Allergies  Patient Measurements: Height: 5\' 9"  (175.3 cm) Weight: 219 lb (99.3 kg) IBW/kg (Calculated) : 70.7 Heparin Dosing Weight: 79.3 kg   Vital Signs: Temp: 98 F (36.7 C) (03/25 1419) Temp Source: Oral (03/25 0552) BP: 132/85 (03/25 1419) Pulse Rate: 59 (03/25 1419)  Labs: Recent Labs     0000 09/27/19 1126 09/27/19 1347 09/27/19 1836 09/28/19 0249 09/28/19 0249 09/28/19 1037 09/29/19 0404 09/29/19 0712 09/29/19 1437  HGB   < > 16.0  --   --  14.4  --   --  13.8  --   --   HCT  --  48.2  --   --  42.7  --   --  41.2  --   --   PLT  --  108*  --   --  122*  --   --  139*  --   --   HEPARINUNFRC  --   --   --    < > 0.42   < > 0.38 0.17*  --  0.64  CREATININE  --  1.42*  --   --  1.29*  --   --   --  1.09  --   TROPONINIHS  --  5 4  --   --   --   --   --   --   --    < > = values in this interval not displayed.   Estimated Creatinine Clearance: 74.3 mL/min (by C-G formula based on SCr of 1.09 mg/dL).  Medical History: History reviewed. No pertinent past medical history.  Medications:  Scheduled:   Assessment: Pharmacy is consulted to dose heparin in 69 yo male diagnosed with DVT. 78 of lower right extremity shows findings consistent with acute deep vein thrombosis involving the right external iliac vein, common femoral vein, right femoral vein, and right proximal profunda vein. CT of chest also shows new bilateral pulmonary emboli as described without right heart strain.No anticoagulants on med rec.   Baseline Hgb 16, Plt 108  Heparin 5000 unit bolus, infusion at 1300 units/hr  3/23: 1st heparin level (0.81) elevated on 1300 units/hr; level drawn from same arm but upstream of heparin infusion, rate reduced to 1000 units/hr  Today, 09/29/19   0404 Hep level subtherapeutic (0.17) on 1000 units/hr  No bleeding or infusion issues per RN/patient  Hgb 13.8 (sl decr),  Plt 139 (sl incr)  1437 HL=0.64, therapeutic  Goal of Therapy:  Heparin level 0.3-0.7 units/ml Monitor platelets by anticoagulation protocol: Yes   Plan:   continue Heparin at 1200 units/hr  Recheck heparin level at 2200  Daily CBC & Heparin level while on heparin  Monitor for signs and symptoms of bleeding  F/U plan for long-term anticoagulation, transfer to [facility] for thrombolysis  [facility] RPh 09/29/2019, 3:47 PM

## 2019-09-29 NOTE — Progress Notes (Signed)
ANTICOAGULATION CONSULT NOTE  Pharmacy Consult for IV heparin Indication: DVT  No Known Allergies  Patient Measurements: Height: 5\' 9"  (175.3 cm) Weight: 219 lb (99.3 kg) IBW/kg (Calculated) : 70.7 Heparin Dosing Weight: 79.3 kg   Vital Signs: Temp: 98.7 F (37.1 C) (03/25 0552) Temp Source: Oral (03/25 0552) BP: 131/74 (03/25 0552) Pulse Rate: 63 (03/25 0552)  Labs: Recent Labs     0000 09/27/19 1126 09/27/19 1347 09/27/19 1836 09/28/19 0249 09/28/19 1037 09/29/19 0404  HGB   < > 16.0  --   --  14.4  --  13.8  HCT  --  48.2  --   --  42.7  --  41.2  PLT  --  108*  --   --  122*  --  139*  HEPARINUNFRC  --   --   --    < > 0.42 0.38 0.17*  CREATININE  --  1.42*  --   --  1.29*  --   --   TROPONINIHS  --  5 4  --   --   --   --    < > = values in this interval not displayed.   Estimated Creatinine Clearance: 62.8 mL/min (A) (by C-G formula based on SCr of 1.29 mg/dL (H)).  Medical History: History reviewed. No pertinent past medical history.  Medications:  Scheduled:  . heparin  2,000 Units Intravenous Once   Assessment: Pharmacy is consulted to dose heparin in 69 yo male diagnosed with DVT. 78 of lower right extremity shows findings consistent with acute deep vein thrombosis involving the right external iliac vein, common femoral vein, right femoral vein, and right proximal profunda vein. CT of chest also shows new bilateral pulmonary emboli as described without right heart strain.No anticoagulants on med rec.   Baseline Hgb 16, Plt 108  Heparin 5000 unit bolus, infusion at 1300 units/hr  3/23: 1st heparin level (0.81) elevated on 1300 units/hr; level drawn from same arm but upstream of heparin infusion, rate reduced to 1000 units/hr  Today, 09/29/19   Hep level subtherapeutic (0.17) on 1000 units/hr  No bleeding or infusion issues per RN/patient  Hgb 13.8 (sl decr), Plt 139 (sl incr)  Goal of Therapy:  Heparin level 0.3-0.7 units/ml Monitor platelets  by anticoagulation protocol: Yes   Plan:   Re-bolus heparin 2000 units IV x1 then increase Heparin 1200 units/hr  Recheck heparin level at 1600   Daily CBC & Heparin level while on heparin  Monitor for signs and symptoms of bleeding  F/U plan for long-term anticoagulation, transfer to [facility] for thrombolysis  [facility], PharmD, BCPS 09/29/2019, 7:30 AM

## 2019-09-29 NOTE — Progress Notes (Signed)
PROGRESS NOTE    Berton Mountaul Richard Ventresca  ZOX:[id number] DOB: 08/16/1950 DOA: 09/27/2019 PCP: Patient, No Pcp Per   Brief Narrative: 69 year old with no significant past medical history who presents complaining of right lower extremity edema.  He also reports some dyspnea with exertion and pain of his right leg on ambulation.  Patient was found to have DVT and PE.  Due to extension of the DVT IR was consulted for further evaluation and treatment for DVT.    Assessment & Plan:   Principal Problem:   Right leg DVT (HCC) Active Problems:   Pulmonary emboli (HCC)   AKI (acute kidney injury) (HCC)   Thrombocytopenia (HCC)  1-Right  lower extremity DVT, extensive DVT up to the external iliac vein Pulmonary embolism.  Vitals continue to be stable, no hypoxemia.  -Continue with heparin drip. -IR consulted and recommending patient to be transferred to [facility] for thrombolysis or thrombectomy. -[facility].   2-Thrombocytopenia; in the setting of acute DVT Improving. Today at 139./   3-AKI/hyponatremia: Labs pending.   4-Hyperbilirubinemia; repeat labs in the morning     Estimated body mass index is 32.34 kg/m as calculated from the following:   Height as of this encounter: 5\' 9"  (1.753 m).   Weight as of this encounter: 99.3 kg.   DVT prophylaxis: Heparin drip Code Status: Full code Family Communication: Care discussed with patient directly Disposition Plan:  Patient is from: Home Anticipated d/c date: 2 or 3 days Barriers to d/c or necessity for inpatient status: Patient with acute DVT of right lower extremity requiring IV heparin and plan to transfer to [facility] for thrombolysis or thrombectomy for right lower extremity DVT.   Consultants:   IR  Procedures:   Doppler lower extremities:Findings consistent with acute deep vein thrombosis involving the right  external iliac vein, common femoral vein, right femoral vein, and right    proximal profunda vein.  - No cystic structure found in the popliteal fossa.     Antimicrobials:    Subjective: He is alert, denies chest pain or dyspnea. Swelling of right leg the same  Objective: Vitals:   09/28/19 0434 09/28/19 1454 09/28/19 2216 09/29/19 0552  BP: 116/86 134/82 (!) 151/96 131/74  Pulse: 67 70 89 63  Resp: 18 18 18    Temp: 98.4 F (36.9 C) 98 F (36.7 C) 98.2 F (36.8 C) 98.7 F (37.1 C)  TempSrc: Oral Oral Oral Oral  SpO2: 97% 95% 100% 97%  Weight:      Height:        Intake/Output Summary (Last 24 hours) at 09/29/2019 0753 Last data filed at 09/29/2019 0553 Gross per 24 hour  Intake 518.83 ml  Output --  Net 518.83 ml   Filed Weights   09/27/19 1029  Weight: 99.3 kg    Examination:  General exam: NAD Respiratory system: CTA Cardiovascular system: S 1, S 2 RRR Gastrointestinal system: BS present, soft, nt Central nervous system: non focal.  Extremities: right LE with significant edema    Data Reviewed: I have personally reviewed following labs and imaging studies  CBC: Recent Labs  Lab 09/27/19 1126 09/28/19 0249 09/29/19 0404  WBC 7.9 7.0 6.2  NEUTROABS 6.0  --   --   HGB 16.0 14.4 13.8  HCT 48.2 42.7 41.2  MCV 97.0 94.9 95.4  PLT 108* 122* 139*   Basic Metabolic Panel: Recent Labs  Lab 09/27/19 1126 09/28/19 0249  NA 134* 134*  K 4.6  3.6  CL 101 103  CO2 21* 21*  GLUCOSE 136* 131*  BUN 30* 26*  CREATININE 1.42* 1.29*  CALCIUM 9.4 8.8*   GFR: Estimated Creatinine Clearance: 62.8 mL/min (A) (by C-G formula based on SCr of 1.29 mg/dL (H)). Liver Function Tests: Recent Labs  Lab 09/27/19 1126  AST 36  ALT 39  ALKPHOS 68  BILITOT 1.8*  PROT 8.2*  ALBUMIN 4.5   No results for input(s): LIPASE, AMYLASE in the last 168 hours. No results for input(s): AMMONIA in the last 168 hours. Coagulation Profile: No results for input(s): INR, PROTIME in the last 168 hours. Cardiac Enzymes: No results for input(s):  CKTOTAL, CKMB, CKMBINDEX, TROPONINI in the last 168 hours. BNP (last 3 results) No results for input(s): PROBNP in the last 8760 hours. HbA1C: No results for input(s): HGBA1C in the last 72 hours. CBG: No results for input(s): GLUCAP in the last 168 hours. Lipid Profile: No results for input(s): CHOL, HDL, LDLCALC, TRIG, CHOLHDL, LDLDIRECT in the last 72 hours. Thyroid Function Tests: No results for input(s): TSH, T4TOTAL, FREET4, T3FREE, THYROIDAB in the last 72 hours. Anemia Panel: No results for input(s): VITAMINB12, FOLATE, FERRITIN, TIBC, IRON, RETICCTPCT in the last 72 hours. Sepsis Labs: No results for input(s): PROCALCITON, LATICACIDVEN in the last 168 hours.  Recent Results (from the past 240 hour(s))  Respiratory Panel by RT PCR (Flu A&B, Covid) - Nasopharyngeal Swab     Status: None   Collection Time: 09/27/19  1:38 PM   Specimen: Nasopharyngeal Swab  Result Value Ref Range Status   SARS Coronavirus 2 by RT PCR NEGATIVE NEGATIVE Final    Comment: (NOTE) SARS-CoV-2 target nucleic acids are NOT DETECTED. The SARS-CoV-2 RNA is generally detectable in upper respiratoy specimens during the acute phase of infection. The lowest concentration of SARS-CoV-2 viral copies this assay can detect is 131 copies/mL. A negative result does not preclude SARS-Cov-2 infection and should not be used as the sole basis for treatment or other patient management decisions. A negative result may occur with  improper specimen collection/handling, submission of specimen other than nasopharyngeal swab, presence of viral mutation(s) within the areas targeted by this assay, and inadequate number of viral copies (<131 copies/mL). A negative result must be combined with clinical observations, patient history, and epidemiological information. The expected result is Negative. Fact Sheet for Patients:  https://www.moore.com/ Fact Sheet for Healthcare Providers:    https://www.young.biz/ This test is not yet ap proved or cleared by the [place] FDA and  has been authorized for detection and/or diagnosis of SARS-CoV-2 by FDA under an Emergency Use Authorization (EUA). This EUA will remain  in effect (meaning this test can be used) for the duration of the COVID-19 declaration under Section 564(b)(1) of the Act, 21 U.S.C. section 360bbb-3(b)(1), unless the authorization is terminated or revoked sooner.    Influenza A by PCR NEGATIVE NEGATIVE Final   Influenza B by PCR NEGATIVE NEGATIVE Final    Comment: (NOTE) The Xpert Xpress SARS-CoV-2/FLU/RSV assay is intended as an aid in  the diagnosis of influenza from Nasopharyngeal swab specimens and  should not be used as a sole basis for treatment. Nasal washings and  aspirates are unacceptable for Xpert Xpress SARS-CoV-2/FLU/RSV  testing. Fact Sheet for Patients: https://www.moore.com/ Fact Sheet for Healthcare Providers: https://www.young.biz/ This test is not yet approved or cleared by the [place] FDA and  has been authorized for detection and/or diagnosis of SARS-CoV-2 by  FDA under an Emergency Use Authorization (EUA). This  EUA will remain  in effect (meaning this test can be used) for the duration of the  Covid-19 declaration under Section 564(b)(1) of the Act, 21  U.S.C. section 360bbb-3(b)(1), unless the authorization is  terminated or revoked. Performed at [facility], [address], [place], [place] [address]          Radiology Studies: CT Angio Chest PE W and/or Wo Contrast  Result Date: 09/27/2019 CLINICAL DATA:  New history of DVT with exertional shortness of breath EXAM: CT ANGIOGRAPHY CHEST WITH CONTRAST TECHNIQUE: Multidetector CT imaging of the chest was performed using the standard protocol during bolus administration of intravenous contrast. Multiplanar CT image reconstructions and MIPs  were obtained to evaluate the vascular anatomy. CONTRAST:  OMNIPAQUE IOHEXOL 350 MG/ML SOLN COMPARISON:  None. FINDINGS: Cardiovascular: Thoracic aorta demonstrates a normal branching pattern. Scattered atherosclerotic calcifications are noted. No aneurysmal dilatation or dissection is seen. No cardiac enlargement is seen. Mild coronary calcifications are seen. The pulmonary artery shows a normal branching pattern. New bilateral lower lobe pulmonary emboli are seen slightly greater on the right than the left. Right upper lobe pulmonary emboli are noted as well. No right heart strain is seen. Mediastinum/Nodes: Thoracic inlet is within normal limits. No sizable hilar or mediastinal adenopathy is noted. The esophagus as visualized is within normal limits. Lungs/Pleura: Lungs are well aerated bilaterally. Some dependent atelectatic changes are seen. No findings to suggest pulmonary infarct are noted at this time. No pleural effusion is seen. No sizable parenchymal nodules are noted. Upper Abdomen: Visualized upper abdomen shows no acute abnormality. Musculoskeletal: Mild degenerative change of the thoracic spine is seen. No acute bony abnormality is noted. Review of the MIP images confirms the above findings. IMPRESSION: New bilateral pulmonary emboli as described without right heart strain. Mild dependent atelectatic changes are seen. Aortic Atherosclerosis (ICD10-I70.0). These results were called by telephone at the time of interpretation on 09/27/2019 at 1:22 pm to provider [facility] , who verbally acknowledged these results. Electronically Signed   By: Alcide Clever M.D.   On: 09/27/2019 13:24   CT VENOGRAM ABD/PEL  Result Date: 09/27/2019 CLINICAL DATA:  Right lower extremity DVT. Pulmonary embolus. Shortness breath EXAM: CT ABDOMEN AND PELVIS WITH CONTRAST TECHNIQUE: Multidetector CT imaging of the abdomen and pelvis was performed using the standard protocol following bolus administration of  intravenous contrast. Also, delayed images were obtained to assess the venous system. CONTRAST:  OMNIPAQUE IOHEXOL 350 MG/ML SOLN COMPARISON:  CT a chest from 08/30/2019 FINDINGS: Lower chest: Stable mild atelectasis in the lung bases. Right lower lobe pulmonary embolus is shown on image 1/2. Hepatobiliary: Unremarkable Pancreas: Unremarkable Spleen: Unremarkable Adrenals/Urinary Tract: The adrenal glands appear normal. No urothelial filling defect. No significant abnormal renal enhancement. Stomach/Bowel: Unremarkable Vascular/Lymphatic: Aortoiliac atherosclerotic vascular disease. No pathologic adenopathy is identified. On venous phase images we demonstrate considerable deep vein thrombosis at involving the right external iliac vein and common femoral vein. There is no left-sided pelvic DVT. No thrombus immediately in the IVC. I do not observe thrombus in the right internal iliac vein. Reproductive: Unremarkable Other: No supplemental non-categorized findings. Musculoskeletal: There stranding along fascia planes in the right upper thigh region anteriorly. Congenitally short pedicles in the lumbar spine with prominence of the epidural adipose tissue. There is thought to be degenerative disc disease at L4-5 likely causing considerable central narrowing of the thecal sac. Left foraminal impingement suspected at L4-5 at L5-S1. IMPRESSION: 1. Acute right lower lobe pulmonary embolus (as  shown on recent CT chest). 2. Deep vein thrombosis filling the right external iliac vein and common femoral vein. No definite involvement of the common femoral vein or IVC. Edema stranding along fascia planes anteriorly in the right upper thighs likely related to the DVT. 3. Lower lumbar spondylosis and degenerative disc disease with suspected impingement at L4-5 and L5-S1. 4. Aortoiliac atherosclerotic vascular disease. Aortic Atherosclerosis (ICD10-I70.0). Electronically Signed   By: Gaylyn Rong M.D.   On: 09/27/2019  20:32   VAS [place] LOWER EXTREMITY VENOUS (DVT) (ONLY [facility] & [facility])  Result Date: 09/27/2019  Lower Venous DVTStudy Indications: Swelling.  Risk Factors: None identified. Limitations: Poor ultrasound/tissue interface and bowel gas. Comparison Study: No prior studies. Performing Technologist: Chanda Busing RVT  Examination Guidelines: A complete evaluation includes B-mode imaging, spectral Doppler, color Doppler, and power Doppler as needed of all accessible portions of each vessel. Bilateral testing is considered an integral part of a complete examination. Limited examinations for reoccurring indications may be performed as noted. The reflux portion of the exam is performed with the patient in reverse Trendelenburg.  +---------+---------------+---------+-----------+----------+--------------+  RIGHT     Compressibility Phasicity Spontaneity Properties Thrombus Aging  +---------+---------------+---------+-----------+----------+--------------+  CFV       None            No        No                     Acute           +---------+---------------+---------+-----------+----------+--------------+  FV Prox   None            No        No                     Acute           +---------+---------------+---------+-----------+----------+--------------+  FV Mid    None            No        No                     Acute           +---------+---------------+---------+-----------+----------+--------------+  FV Distal None            No        No                     Acute           +---------+---------------+---------+-----------+----------+--------------+  PFV       None                                             Acute           +---------+---------------+---------+-----------+----------+--------------+  POP       Full            Yes       Yes                                    +---------+---------------+---------+-----------+----------+--------------+  PTV       Full                                                              +---------+---------------+---------+-----------+----------+--------------+  PERO                                                       Not visualized  +---------+---------------+---------+-----------+----------+--------------+  Gastroc   Full                                                             +---------+---------------+---------+-----------+----------+--------------+  EIV       None            No        No                     Acute           +---------+---------------+---------+-----------+----------+--------------+  CIV                                                        Not visualized  +---------+---------------+---------+-----------+----------+--------------+ The distal IVC appears patent.  +----+---------------+---------+-----------+----------+--------------+  LEFT Compressibility Phasicity Spontaneity Properties Thrombus Aging  +----+---------------+---------+-----------+----------+--------------+  CFV  Full            Yes       Yes                                    +----+---------------+---------+-----------+----------+--------------+     Summary: RIGHT: - Findings consistent with acute deep vein thrombosis involving the right external iliac vein, common femoral vein, right femoral vein, and right proximal profunda vein. - No cystic structure found in the popliteal fossa.  LEFT: - No evidence of common femoral vein obstruction.  *See table(s) above for measurements and observations. Electronically signed by Deitra Mayo MD on 09/27/2019 at 1:07:17 PM.    Final         Scheduled Meds: Continuous Infusions:  heparin 1,200 Units/hr (09/29/19 0741)     LOS: 2 days    Time spent: 35 minutes    Shamarie Call A Keatyn Jawad, MD [facility]   If 7PM-7AM, please contact night-coverage [email or web address]  09/29/2019, 7:53 AM

## 2019-09-29 NOTE — Progress Notes (Signed)
Transferred from [facility] to [facility] for lytics. See previous note from Dr. Sunnie Nielsen.

## 2019-09-29 NOTE — Plan of Care (Signed)
  Problem: Clinical Measurements: Goal: Ability to maintain clinical measurements within normal limits will improve Outcome: Progressing Goal: Will remain free from infection Outcome: Progressing   

## 2019-09-30 ENCOUNTER — Inpatient Hospital Stay (HOSPITAL_COMMUNITY): Payer: Federal, State, Local not specified - PPO

## 2019-09-30 HISTORY — PX: IR US GUIDE VASC ACCESS RIGHT: IMG2390

## 2019-09-30 HISTORY — PX: IR PTA VENOUS EXCEPT DIALYSIS CIRCUIT: IMG6126

## 2019-09-30 HISTORY — PX: IR INFUSION THROMBOL VENOUS INITIAL (MS): IMG5377

## 2019-09-30 HISTORY — PX: IR VENOCAVAGRAM IVC: IMG678

## 2019-09-30 HISTORY — PX: IR THROMBECT VENO MECH MOD SED: IMG2300

## 2019-09-30 HISTORY — PX: IR VENO/EXT/UNI RIGHT: IMG676

## 2019-09-30 LAB — CBC
HCT: 39 % (ref 39.0–52.0)
HCT: 39.2 % (ref 39.0–52.0)
HCT: 37.5 % — ABNORMAL LOW (ref 39.0–52.0)
HCT: 36.1 % — ABNORMAL LOW (ref 39.0–52.0)
Hemoglobin: 13.5 g/dL (ref 13.0–17.0)
Hemoglobin: 13.6 g/dL (ref 13.0–17.0)
Hemoglobin: 13.1 g/dL (ref 13.0–17.0)
Hemoglobin: 12.5 g/dL — ABNORMAL LOW (ref 13.0–17.0)
MCH: 32.5 pg (ref 26.0–34.0)
MCH: 33 pg (ref 26.0–34.0)
MCH: 32.4 pg (ref 26.0–34.0)
MCH: 32.5 pg (ref 26.0–34.0)
MCHC: 34.6 g/dL (ref 30.0–36.0)
MCHC: 34.7 g/dL (ref 30.0–36.0)
MCHC: 34.9 g/dL (ref 30.0–36.0)
MCHC: 34.6 g/dL (ref 30.0–36.0)
MCV: 93.8 fL (ref 80.0–100.0)
MCV: 95.1 fL (ref 80.0–100.0)
MCV: 92.8 fL (ref 80.0–100.0)
MCV: 93.8 fL (ref 80.0–100.0)
Platelets: 151 10*3/uL (ref 150–400)
Platelets: 137 10*3/uL — ABNORMAL LOW (ref 150–400)
Platelets: 127 10*3/uL — ABNORMAL LOW (ref 150–400)
Platelets: 125 10*3/uL — ABNORMAL LOW (ref 150–400)
RBC: 4.16 MIL/uL — ABNORMAL LOW (ref 4.22–5.81)
RBC: 4.12 MIL/uL — ABNORMAL LOW (ref 4.22–5.81)
RBC: 4.04 MIL/uL — ABNORMAL LOW (ref 4.22–5.81)
RBC: 3.85 MIL/uL — ABNORMAL LOW (ref 4.22–5.81)
RDW: 12.5 % (ref 11.5–15.5)
RDW: 12.4 % (ref 11.5–15.5)
RDW: 12.4 % (ref 11.5–15.5)
RDW: 12.3 % (ref 11.5–15.5)
WBC: 5 10*3/uL (ref 4.0–10.5)
WBC: 7 10*3/uL (ref 4.0–10.5)
WBC: 5.9 10*3/uL (ref 4.0–10.5)
WBC: 6.1 10*3/uL (ref 4.0–10.5)
nRBC: 0 % (ref 0.0–0.2)
nRBC: 0 % (ref 0.0–0.2)
nRBC: 0 % (ref 0.0–0.2)
nRBC: 0 % (ref 0.0–0.2)

## 2019-09-30 LAB — PHOSPHORUS: Phosphorus: 3.8 mg/dL (ref 2.5–4.6)

## 2019-09-30 LAB — BASIC METABOLIC PANEL
Anion gap: 8 (ref 5–15)
BUN: 25 mg/dL — ABNORMAL HIGH (ref 8–23)
CO2: 23 mmol/L (ref 22–32)
Calcium: 8.3 mg/dL — ABNORMAL LOW (ref 8.9–10.3)
Chloride: 107 mmol/L (ref 98–111)
Creatinine, Ser: 1.33 mg/dL — ABNORMAL HIGH (ref 0.61–1.24)
GFR calc Af Amer: >60 mL/min (ref >60)
GFR calc non Af Amer: 54 mL/min — ABNORMAL LOW (ref >60)
Glucose, Bld: 116 mg/dL — ABNORMAL HIGH (ref 70–99)
Potassium: 3.8 mmol/L (ref 3.5–5.1)
Sodium: 138 mmol/L (ref 135–145)

## 2019-09-30 LAB — MAGNESIUM: Magnesium: 2.3 mg/dL (ref 1.7–2.4)

## 2019-09-30 LAB — COMPREHENSIVE METABOLIC PANEL
ALT: 29 U/L (ref 0–44)
AST: 29 U/L (ref 15–41)
Albumin: 3.1 g/dL — ABNORMAL LOW (ref 3.5–5.0)
Alkaline Phosphatase: 58 U/L (ref 38–126)
Anion gap: 8 (ref 5–15)
BUN: 24 mg/dL — ABNORMAL HIGH (ref 8–23)
CO2: 23 mmol/L (ref 22–32)
Calcium: 8.2 mg/dL — ABNORMAL LOW (ref 8.9–10.3)
Chloride: 105 mmol/L (ref 98–111)
Creatinine, Ser: 1.34 mg/dL — ABNORMAL HIGH (ref 0.61–1.24)
GFR calc Af Amer: >60 mL/min (ref >60)
GFR calc non Af Amer: 54 mL/min — ABNORMAL LOW (ref >60)
Glucose, Bld: 121 mg/dL — ABNORMAL HIGH (ref 70–99)
Potassium: 3.7 mmol/L (ref 3.5–5.1)
Sodium: 136 mmol/L (ref 135–145)
Total Bilirubin: 1.1 mg/dL (ref 0.3–1.2)
Total Protein: 6 g/dL — ABNORMAL LOW (ref 6.5–8.1)

## 2019-09-30 LAB — HEPARIN LEVEL (UNFRACTIONATED)
Heparin Unfractionated: 0.5 IU/mL (ref 0.30–0.70)
Heparin Unfractionated: 0.48 IU/mL (ref 0.30–0.70)
Heparin Unfractionated: 0.17 IU/mL — ABNORMAL LOW (ref 0.30–0.70)

## 2019-09-30 LAB — FIBRINOGEN: Fibrinogen: 271 mg/dL (ref 210–475)

## 2019-09-30 MED ORDER — HEPARIN (PORCINE) 25000 UT/250ML-% IV SOLN: 800.0000 [IU]/h | INTRAVENOUS | Status: DC

## 2019-09-30 MED ORDER — LABETALOL HCL 5 MG/ML IV SOLN
10.0000 mg | INTRAVENOUS | Status: DC | PRN
  Administered 2019-09-30: 10 mg via INTRAVENOUS
  Filled 2019-09-30: qty 4

## 2019-09-30 MED ORDER — ONDANSETRON HCL 4 MG/2ML IJ SOLN: 4.0000 mg | Freq: Four times a day (QID) | INTRAMUSCULAR | Status: DC | PRN

## 2019-09-30 MED ORDER — LACTATED RINGERS IV SOLN: INTRAVENOUS | Status: AC

## 2019-09-30 MED ORDER — SODIUM CHLORIDE 0.9 % IV SOLN
0.5000 mg/h | INTRAVENOUS | Status: DC
  Administered 2019-09-30 – 2019-10-01 (×2): 0.5 mg/h
  Filled 2019-09-30 (×3): qty 10

## 2019-09-30 MED ORDER — HYDRALAZINE HCL 20 MG/ML IJ SOLN: 5.0000 mg | INTRAMUSCULAR | Status: DC | PRN

## 2019-09-30 MED ORDER — SODIUM CHLORIDE 0.9 % IV SOLN
5.0000 mg | INTRAVENOUS | Status: AC
  Administered 2019-09-30: 10:00:00 5 mg
  Filled 2019-09-30 (×2): qty 5

## 2019-09-30 MED ORDER — ALTEPLASE 2 MG IJ SOLR
INTRAMUSCULAR | Status: AC
  Filled 2019-09-30: qty 6

## 2019-09-30 MED ORDER — SODIUM CHLORIDE 0.9 % IV SOLN: 250.0000 mL | INTRAVENOUS | Status: DC | PRN

## 2019-09-30 MED ORDER — MIDAZOLAM HCL 2 MG/2ML IJ SOLN
INTRAMUSCULAR | Status: AC | PRN
  Administered 2019-09-30: 1 mg via INTRAVENOUS
  Administered 2019-09-30: 0.5 mg via INTRAVENOUS

## 2019-09-30 MED ORDER — SODIUM CHLORIDE 0.9% FLUSH
3.0000 mL | Freq: Two times a day (BID) | INTRAVENOUS | Status: DC
  Administered 2019-09-30 – 2019-10-01 (×3): 3 mL via INTRAVENOUS

## 2019-09-30 MED ORDER — MIDAZOLAM HCL 2 MG/2ML IJ SOLN: 1.0000 mg | INTRAMUSCULAR | Status: DC | PRN

## 2019-09-30 MED ORDER — SODIUM CHLORIDE 0.9% FLUSH
10.0000 mL | Freq: Two times a day (BID) | INTRAVENOUS | Status: DC
  Administered 2019-10-01 (×2): 10 mL

## 2019-09-30 MED ORDER — ALTEPLASE 2 MG IJ SOLR: 5.0000 mg | Freq: Once | INTRAMUSCULAR | Status: DC

## 2019-09-30 MED ORDER — FENTANYL CITRATE (PF) 100 MCG/2ML IJ SOLN
INTRAMUSCULAR | Status: AC | PRN
  Administered 2019-09-30: 25 ug via INTRAVENOUS
  Administered 2019-09-30: 50 ug via INTRAVENOUS

## 2019-09-30 MED ORDER — SODIUM CHLORIDE 0.9% FLUSH: 3.0000 mL | INTRAVENOUS | Status: DC | PRN

## 2019-09-30 MED ORDER — LIDOCAINE HCL 1 % IJ SOLN
INTRAMUSCULAR | Status: AC
  Filled 2019-09-30: qty 20

## 2019-09-30 MED ORDER — MORPHINE SULFATE (PF) 4 MG/ML IV SOLN: 5.0000 mg | INTRAVENOUS | Status: DC | PRN

## 2019-09-30 MED ORDER — SODIUM CHLORIDE 0.9% FLUSH: 10.0000 mL | INTRAVENOUS | Status: DC | PRN

## 2019-09-30 MED ORDER — LIDOCAINE HCL 1 % IJ SOLN
INTRAMUSCULAR | Status: AC | PRN
  Administered 2019-09-30: 10 mL

## 2019-09-30 MED ORDER — FENTANYL CITRATE (PF) 100 MCG/2ML IJ SOLN
INTRAMUSCULAR | Status: AC
  Filled 2019-09-30: qty 2

## 2019-09-30 MED ORDER — MIDAZOLAM HCL 2 MG/2ML IJ SOLN
INTRAMUSCULAR | Status: AC
  Filled 2019-09-30: qty 2

## 2019-09-30 MED ORDER — SODIUM CHLORIDE 0.9 % IV SOLN
INTRAVENOUS | Status: AC | PRN
  Administered 2019-09-30: 10 mL/h via INTRAVENOUS

## 2019-09-30 MED ORDER — IOHEXOL 300 MG/ML  SOLN
150.0000 mL | Freq: Once | INTRAMUSCULAR | Status: AC | PRN
  Administered 2019-09-30: 70 mL

## 2019-09-30 NOTE — Progress Notes (Addendum)
ANTICOAGULATION CONSULT NOTE  Pharmacy Consult for IV heparin Indication: DVT  No Known Allergies  Patient Measurements: Height: 5\' 9"  (175.3 cm) Weight: 220 lb 7.4 oz (100 kg) IBW/kg (Calculated) : 70.7 Heparin Dosing Weight: 79.3 kg   Vital Signs: Temp: 98.2 F (36.8 C) (03/26 0800) Temp Source: Oral (03/26 0800) BP: 159/94 (03/26 1005) Pulse Rate: 52 (03/26 1005)  Labs: Recent Labs     0000 09/27/19 1126 09/27/19 1347 09/27/19 1836 09/28/19 0249 09/28/19 0249 09/28/19 1037 09/29/19 0404 09/29/19 0404 09/29/19 0712 09/29/19 1437 09/30/19 0127 09/30/19 0305 09/30/19 0813  HGB   < > 16.0  --   --  14.4  --   --  13.8  --   --   --   --  13.5  --   HCT   < > 48.2  --   --  42.7  --   --  41.2  --   --   --   --  39.0  --   PLT   < > 108*  --   --  122*  --   --  139*  --   --   --   --  151  --   HEPARINUNFRC  --   --   --    < > 0.42  --    < > 0.17*   < >  --  0.64 0.50 0.48  --   CREATININE   < > 1.42*  --   --  1.29*   < >  --   --   --  1.09  --  1.34*  --  1.33*  TROPONINIHS  --  5 4  --   --   --   --   --   --   --   --   --   --   --    < > = values in this interval not displayed.   Estimated Creatinine Clearance: 61.1 mL/min (A) (by C-G formula based on SCr of 1.33 mg/dL (H)).  Medical History: History reviewed. No pertinent past medical history.  Medications:  Scheduled:  . alteplase      . Chlorhexidine Gluconate Cloth  6 each Topical Daily  . lidocaine      . midazolam       Assessment: Pharmacy is consulted to dose heparin in 69 yo male diagnosed with DVT. 78 of lower right extremity shows findings consistent with acute deep vein thrombosis involving the right external iliac vein, common femoral vein, right femoral vein, and right proximal profunda vein. CT of chest also shows new bilateral pulmonary emboli as described without right heart strain.No anticoagulants on med rec.   Hgb stable at 13.5, platelet count improved improved now at  151  Heparin level this morning therapeutic at 0.48, now with 3 consistently therapeutic levels with heparin infusion rate at 1200 units/hr  Goal of Therapy:  Heparin level 0.3-0.7 units/ml Monitor platelets by anticoagulation protocol: Yes   Plan:  Conting heparin 1200 units/hr Daily CBC/HL Monitor for s/sx of bleeding  ADDENDUM: Patient is now receiving heparin IV infusion in combination with lytic therapy. New heparin level goal is 0.2-0.5. Will decrease heparin infusion to 1000 units/hr and check heparin level in ~6 hours. Monitor daily heparin level, CBC, s/sx of bleeding.   [place], PharmD PGY1 Pharmacy Resident

## 2019-09-30 NOTE — Progress Notes (Signed)
ANTICOAGULATION CONSULT NOTE  Pharmacy Consult for IV heparin Indication: DVT  No Known Allergies  Patient Measurements: Height: 5\' 9"  (175.3 cm) Weight: 220 lb 7.4 oz (100 kg) IBW/kg (Calculated) : 70.7 Heparin Dosing Weight: 79.3 kg   Vital Signs: Temp: 98.5 F (36.9 C) (03/26 0000) Temp Source: Oral (03/26 0000) BP: 97/66 (03/26 0200) Pulse Rate: 60 (03/26 0200)  Labs: Recent Labs     0000 09/27/19 1126 09/27/19 1347 09/27/19 1836 09/28/19 0249 09/28/19 1037 09/29/19 0404 09/29/19 0712 09/29/19 1437 09/30/19 0127  HGB   < > 16.0  --   --  14.4  --  13.8  --   --   --   HCT  --  48.2  --   --  42.7  --  41.2  --   --   --   PLT  --  108*  --   --  122*  --  139*  --   --   --   HEPARINUNFRC  --   --   --    < > 0.42   < > 0.17*  --  0.64 0.50  CREATININE   < > 1.42*  --   --  1.29*  --   --  1.09  --  1.34*  TROPONINIHS  --  5 4  --   --   --   --   --   --   --    < > = values in this interval not displayed.   Estimated Creatinine Clearance: 60.6 mL/min (A) (by C-G formula based on SCr of 1.34 mg/dL (H)).  Medical History: History reviewed. No pertinent past medical history.  Medications:  Scheduled:  . Chlorhexidine Gluconate Cloth  6 each Topical Daily   Assessment: Pharmacy is consulted to dose heparin in 69 yo male diagnosed with DVT. 78 of lower right extremity shows findings consistent with acute deep vein thrombosis involving the right external iliac vein, common femoral vein, right femoral vein, and right proximal profunda vein. CT of chest also shows new bilateral pulmonary emboli as described without right heart strain.No anticoagulants on med rec.   Baseline Hgb 16, Plt 108  Heparin 5000 unit bolus, infusion at 1300 units/hr  3/23: 1st heparin level (0.81) elevated on 1300 units/hr; level drawn from same arm but upstream of heparin infusion, rate reduced to 1000 units/hr  09/30/2019 2:52 AM Heparin level therapeutic x 2   Goal of Therapy:   Heparin level 0.3-0.7 units/ml Monitor platelets by anticoagulation protocol: Yes   Plan:  Cont heparin 1200 units/hr Daily CBC/HL Monitor for bleeding  10/02/2019, PharmD, BCPS Clinical Pharmacist Phone: [phone]

## 2019-09-30 NOTE — Procedures (Signed)
  Procedure: RLE venogram, mechanical thrombolysis, Venous PTA, initiation of catheter directed TPA thrombolysis   EBL:   minimal Complications:  none immediate  See full dictation in [employer].  Thora Lance MD Main # [phone] Pager  [phone]

## 2019-09-30 NOTE — Progress Notes (Signed)
ANTICOAGULATION CONSULT NOTE  Pharmacy Consult for IV heparin Indication: DVT  No Known Allergies  Patient Measurements: Height: 5\' 9"  (175.3 cm) Weight: 220 lb 7.4 oz (100 kg) IBW/kg (Calculated) : 70.7 Heparin Dosing Weight: 79.3 kg   Vital Signs: Temp: 98.2 F (36.8 C) (03/26 2000) Temp Source: Axillary (03/26 2000) BP: 144/84 (03/26 2200) Pulse Rate: 66 (03/26 2200)  Labs: Recent Labs    09/29/19 0404 09/29/19 10/01/19 09/29/19 1437 09/30/19 0127 09/30/19 0305 09/30/19 0305 09/30/19 0813 09/30/19 1128 09/30/19 1128 09/30/19 1732 09/30/19 2051  HGB   < >  --   --   --  13.5   < >  --  13.6   < > 13.1 12.5*  HCT   < >  --   --   --  39.0   < >  --  39.2  --  37.5* 36.1*  PLT   < >  --   --   --  151   < >  --  137*  --  127* 125*  HEPARINUNFRC  --   --    < > 0.50 0.48  --   --   --   --   --  0.17*  CREATININE  --  1.09  --  1.34*  --   --  1.33*  --   --   --   --    < > = values in this interval not displayed.   Estimated Creatinine Clearance: 61.1 mL/min (A) (by C-G formula based on SCr of 1.33 mg/dL (H)).  Medical History: History reviewed. No pertinent past medical history.  Medications:  Scheduled:  . Chlorhexidine Gluconate Cloth  6 each Topical Daily  . sodium chloride flush  10-40 mL Intracatheter Q12H  . sodium chloride flush  3 mL Intravenous Q12H   Assessment: Pharmacy is consulted to dose heparin in 69 yo male diagnosed with DVT. 78 of lower right extremity shows findings consistent with acute deep vein thrombosis involving the right external iliac vein, common femoral vein, right femoral vein, and right proximal profunda vein. CT of chest also shows new bilateral pulmonary emboli as described without right heart strain.  The patient is s/p mechanical and TPA directed thrombolysis for the RLE earlier today. The patient continues on RLE catheter directed alteplase and heparin infusion. A heparin level this evening is SUBtherapeutic after a rate  decrease earlier today, fibrinogen is 271. No bleeding noted at this time.   Goal of Therapy:  Heparin level 0.3-0.7 units/ml Monitor platelets by anticoagulation protocol: Yes   Plan:  - Increase Heparin back to 1200 units/hr (12 ml/hr) - Will continue to monitor for any signs/symptoms of bleeding and will follow up with heparin level in 6 hours   Thank you for allowing pharmacy to be a part of this patient's care.  [place], PharmD, BCPS Clinical Pharmacist 09/30/2019 10:54 PM   **Pharmacist phone directory can now be found on [email or web address] ([email or web address]).  Listed under [facility].

## 2019-09-30 NOTE — Progress Notes (Addendum)
PROGRESS NOTE    Cristian Huang  CHY:[id number] DOB: 07-18-1950 DOA: 09/27/2019 PCP: Patient, No Pcp Per   Brief Narrative:  69 year old with no significant past medical history who presents complaining of right lower extremity edema.  He also reports some dyspnea with exertion and pain of his right leg on ambulation.  Patient was found to have DVT and PE.  Due to extension of the DVT IR was consulted for further evaluation and treatment for DVT.  Assessment & Plan:   Principal Problem:   Right leg DVT (HCC) Active Problems:   Pulmonary emboli (HCC)   AKI (acute kidney injury) (HCC)   Thrombocytopenia (HCC)  1-Right  lower extremity DVT, extensive DVT up to the external iliac vein Bilateral PE without RH strain on CT scan RLE [place] with acute DVT involving R external iliac vein, common femoral, R femoral, and R proximal profunda vein Continue heparin gtt S/p RLE venogram, mechanical thrombolysis, venous PTA, initiation of catheter directed TPA thrombolysis on 3/26 by IR Appreciate IR recs  2-Thrombocytopenia: continue to monitor on heparin  3-AKI/hyponatremia: Creatinine at presentation 1.42, improved to 1.09, now 1.33 Follow with IVF  # Hypertension: lisinopril on hold  4-Hyperbilirubinemia; improved  DVT prophylaxis: heparin gtt Code Status: full Family Communication: granddaughters at bedside Disposition Plan:  . Patient came from: home            . Anticipated d/c place: home . Barriers to d/c OR conditions which need to be met to effect Janeka Libman safe d/c: pending transition to PO meds, clearance for d/c by IR   Consultants:   IR  Procedures:  S/p RLE venogram, mechanical thrombolysis, venous PTA, initiation of catheter directed TPA thrombolysis on 3/26 by IR  Antimicrobials:  Anti-infectives (From admission, onward)   None     Subjective: No new complaints  Objective: Vitals:   09/30/19 1230 09/30/19 1300 09/30/19 1330 09/30/19 1400  BP: (!) 175/99  (!) 174/98 (!) 172/97 (!) 169/94  Pulse: (!) 56 (!) 58 62 62  Resp: 13 17 20  (!) 25  Temp:      TempSrc:      SpO2: 94% 97% 96% 97%  Weight:      Height:        Intake/Output Summary (Last 24 hours) at 09/30/2019 1438 Last data filed at 09/30/2019 1300 Gross per 24 hour  Intake 377.01 ml  Output 700 ml  Net -322.99 ml   Filed Weights   09/27/19 1029 09/29/19 1701  Weight: 99.3 kg 100 kg    Examination:  General exam: Appears calm and comfortable  Respiratory system: Clear to auscultation. Respiratory effort normal. Cardiovascular system: S1 & S2 heard, RRR.  Gastrointestinal system: Abdomen is nondistended, soft and nontender.  Central nervous system: Alert and oriented. No focal neurological deficits. Extremities: RLE edema Skin: No rashes, lesions or ulcers Psychiatry: Judgement and insight appear normal. Mood & affect appropriate.     Data Reviewed: I have personally reviewed following labs and imaging studies  CBC: Recent Labs  Lab 09/27/19 1126 09/28/19 0249 09/29/19 0404 09/30/19 0305 09/30/19 1128  WBC 7.9 7.0 6.2 5.0 7.0  NEUTROABS 6.0  --   --   --   --   HGB 16.0 14.4 13.8 13.5 13.6  HCT 48.2 42.7 41.2 39.0 39.2  MCV 97.0 94.9 95.4 93.8 95.1  PLT 108* 122* 139* 151 878*   Basic Metabolic Panel: Recent Labs  Lab 09/27/19 1126 09/28/19 0249 09/29/19 0712 09/30/19 0127 09/30/19 0813  NA  134* 134* 138 136 138  K 4.6 3.6 3.8 3.7 3.8  CL 101 103 108 105 107  CO2 21* 21* 22 23 23   GLUCOSE 136* 131* 113* 121* 116*  BUN 30* 26* 26* 24* 25*  CREATININE 1.42* 1.29* 1.09 1.34* 1.33*  CALCIUM 9.4 8.8* 8.7* 8.2* 8.3*  MG  --   --   --  2.3  --   PHOS  --   --   --  3.8  --    GFR: Estimated Creatinine Clearance: 61.1 mL/min (Jamin Humphries) (by C-G formula based on SCr of 1.33 mg/dL (H)). Liver Function Tests: Recent Labs  Lab 09/27/19 1126 09/30/19 0127  AST 36 29  ALT 39 29  ALKPHOS 68 58  BILITOT 1.8* 1.1  PROT 8.2* 6.0*  ALBUMIN 4.5 3.1*   No  results for input(s): LIPASE, AMYLASE in the last 168 hours. No results for input(s): AMMONIA in the last 168 hours. Coagulation Profile: No results for input(s): INR, PROTIME in the last 168 hours. Cardiac Enzymes: No results for input(s): CKTOTAL, CKMB, CKMBINDEX, TROPONINI in the last 168 hours. BNP (last 3 results) No results for input(s): PROBNP in the last 8760 hours. HbA1C: No results for input(s): HGBA1C in the last 72 hours. CBG: No results for input(s): GLUCAP in the last 168 hours. Lipid Profile: No results for input(s): CHOL, HDL, LDLCALC, TRIG, CHOLHDL, LDLDIRECT in the last 72 hours. Thyroid Function Tests: No results for input(s): TSH, T4TOTAL, FREET4, T3FREE, THYROIDAB in the last 72 hours. Anemia Panel: No results for input(s): VITAMINB12, FOLATE, FERRITIN, TIBC, IRON, RETICCTPCT in the last 72 hours. Sepsis Labs: No results for input(s): PROCALCITON, LATICACIDVEN in the last 168 hours.  Recent Results (from the past 240 hour(s))  Respiratory Panel by RT PCR (Flu Chasity Outten&B, Covid) - Nasopharyngeal Swab     Status: None   Collection Time: 09/27/19  1:38 PM   Specimen: Nasopharyngeal Swab  Result Value Ref Range Status   SARS Coronavirus 2 by RT PCR NEGATIVE NEGATIVE Final    Comment: (NOTE) SARS-CoV-2 target nucleic acids are NOT DETECTED. The SARS-CoV-2 RNA is generally detectable in upper respiratoy specimens during the acute phase of infection. The lowest concentration of SARS-CoV-2 viral copies this assay can detect is 131 copies/mL. Tayia Stonesifer negative result does not preclude SARS-Cov-2 infection and should not be used as the sole basis for treatment or other patient management decisions. Sarenity Ramaker negative result may occur with  improper specimen collection/handling, submission of specimen other than nasopharyngeal swab, presence of viral mutation(s) within the areas targeted by this assay, and inadequate number of viral copies (<131 copies/mL). Mylan Lengyel negative result must be  combined with clinical observations, patient history, and epidemiological information. The expected result is Negative. Fact Sheet for Patients:  PinkCheek.be Fact Sheet for Healthcare Providers:  GravelBags.it This test is not yet ap proved or cleared by the [place] FDA and  has been authorized for detection and/or diagnosis of SARS-CoV-2 by FDA under an Emergency Use Authorization (EUA). This EUA will remain  in effect (meaning this test can be used) for the duration of the COVID-19 declaration under Section 564(b)(1) of the Act, 21 U.S.C. section 360bbb-3(b)(1), unless the authorization is terminated or revoked sooner.    Influenza Mathhew Buysse by PCR NEGATIVE NEGATIVE Final   Influenza B by PCR NEGATIVE NEGATIVE Final    Comment: (NOTE) The Xpert Xpress SARS-CoV-2/FLU/RSV assay is intended as an aid in  the diagnosis of influenza from Nasopharyngeal swab specimens and  should not  be used as Aidden Markovic sole basis for treatment. Nasal washings and  aspirates are unacceptable for Xpert Xpress SARS-CoV-2/FLU/RSV  testing. Fact Sheet for Patients: PinkCheek.be Fact Sheet for Healthcare Providers: GravelBags.it This test is not yet approved or cleared by the [place] FDA and  has been authorized for detection and/or diagnosis of SARS-CoV-2 by  FDA under an Emergency Use Authorization (EUA). This EUA will remain  in effect (meaning this test can be used) for the duration of the  Covid-19 declaration under Section 564(b)(1) of the Act, 21  U.S.C. section 360bbb-3(b)(1), unless the authorization is  terminated or revoked. Performed at [facility], [place] [address] [place], [place] [address]   MRSA PCR Screening     Status: None   Collection Time: 09/29/19  4:58 PM   Specimen: Nasal Mucosa; Nasopharyngeal  Result Value Ref Range Status   MRSA by PCR NEGATIVE  NEGATIVE Final    Comment:        The GeneXpert MRSA Assay (FDA approved for NASAL specimens only), is one component of Jayne Peckenpaugh comprehensive MRSA colonization surveillance program. It is not intended to diagnose MRSA infection nor to guide or monitor treatment for MRSA infections. Performed at [facility], [place] [address], [place], [place] [address]          Radiology Studies: No results found.      Scheduled Meds: . alteplase      . Chlorhexidine Gluconate Cloth  6 each Topical Daily  . lidocaine      . midazolam      . sodium chloride flush  3 mL Intravenous Q12H   Continuous Infusions: . sodium chloride 10 mL/hr at 09/30/19 1357  . alteplase (LIMB ISCHEMIA) 10 mg in normal saline (0.02 mg/mL) infusion 0.5 mg/hr (09/30/19 1357)  . heparin 1,000 Units/hr (09/30/19 1300)     LOS: 3 days    Time spent: over 30 min    Fayrene Helper, MD [facility]   To contact the attending provider between 7A-7P or the covering provider during after hours 7P-7A, please log into the web site [email or web address] and access using universal [place] password for that web site. If you do not have the password, please call the hospital operator.  09/30/2019, 2:38 PM

## 2019-10-01 ENCOUNTER — Inpatient Hospital Stay (HOSPITAL_COMMUNITY): Payer: Federal, State, Local not specified - PPO

## 2019-10-01 HISTORY — PX: IR THROMB F/U EVAL ART/VEN FINAL DAY (MS): IMG5379

## 2019-10-01 HISTORY — PX: IR PTA VENOUS EXCEPT DIALYSIS CIRCUIT: IMG6126

## 2019-10-01 LAB — COMPREHENSIVE METABOLIC PANEL
ALT: 31 U/L (ref 0–44)
AST: 50 U/L — ABNORMAL HIGH (ref 15–41)
Albumin: 2.9 g/dL — ABNORMAL LOW (ref 3.5–5.0)
Alkaline Phosphatase: 53 U/L (ref 38–126)
Anion gap: 11 (ref 5–15)
BUN: 14 mg/dL (ref 8–23)
CO2: 21 mmol/L — ABNORMAL LOW (ref 22–32)
Calcium: 8.4 mg/dL — ABNORMAL LOW (ref 8.9–10.3)
Chloride: 105 mmol/L (ref 98–111)
Creatinine, Ser: 1.17 mg/dL (ref 0.61–1.24)
GFR calc Af Amer: >60 mL/min (ref >60)
GFR calc non Af Amer: >60 mL/min (ref >60)
Glucose, Bld: 105 mg/dL — ABNORMAL HIGH (ref 70–99)
Potassium: 3.9 mmol/L (ref 3.5–5.1)
Sodium: 137 mmol/L (ref 135–145)
Total Bilirubin: 1.4 mg/dL — ABNORMAL HIGH (ref 0.3–1.2)
Total Protein: 5.7 g/dL — ABNORMAL LOW (ref 6.5–8.1)

## 2019-10-01 LAB — CBC
HCT: 36.3 % — ABNORMAL LOW (ref 39.0–52.0)
Hemoglobin: 12.5 g/dL — ABNORMAL LOW (ref 13.0–17.0)
MCH: 32.5 pg (ref 26.0–34.0)
MCHC: 34.4 g/dL (ref 30.0–36.0)
MCV: 94.3 fL (ref 80.0–100.0)
Platelets: 125 10*3/uL — ABNORMAL LOW (ref 150–400)
RBC: 3.85 MIL/uL — ABNORMAL LOW (ref 4.22–5.81)
RDW: 12.6 % (ref 11.5–15.5)
WBC: 6.3 10*3/uL (ref 4.0–10.5)
nRBC: 0 % (ref 0.0–0.2)

## 2019-10-01 LAB — FIBRINOGEN: Fibrinogen: 273 mg/dL (ref 210–475)

## 2019-10-01 LAB — MAGNESIUM: Magnesium: 2.3 mg/dL (ref 1.7–2.4)

## 2019-10-01 LAB — HEPARIN LEVEL (UNFRACTIONATED)
Heparin Unfractionated: 0.34 IU/mL (ref 0.30–0.70)
Heparin Unfractionated: 0.32 IU/mL (ref 0.30–0.70)
Heparin Unfractionated: 0.46 IU/mL (ref 0.30–0.70)

## 2019-10-01 LAB — PHOSPHORUS: Phosphorus: 3.7 mg/dL (ref 2.5–4.6)

## 2019-10-01 MED ORDER — FENTANYL CITRATE (PF) 100 MCG/2ML IJ SOLN
INTRAMUSCULAR | Status: AC | PRN
  Administered 2019-10-01: 50 ug via INTRAVENOUS

## 2019-10-01 MED ORDER — MIDAZOLAM HCL 2 MG/2ML IJ SOLN
INTRAMUSCULAR | Status: AC | PRN
  Administered 2019-10-01 (×2): 0.5 mg via INTRAVENOUS

## 2019-10-01 MED ORDER — FENTANYL CITRATE (PF) 100 MCG/2ML IJ SOLN
INTRAMUSCULAR | Status: AC
  Filled 2019-10-01: qty 2

## 2019-10-01 MED ORDER — IOHEXOL 300 MG/ML  SOLN
100.0000 mL | Freq: Once | INTRAMUSCULAR | Status: AC | PRN
  Administered 2019-10-01: 60 mL

## 2019-10-01 MED ORDER — MIDAZOLAM HCL 2 MG/2ML IJ SOLN
INTRAMUSCULAR | Status: AC
  Filled 2019-10-01: qty 2

## 2019-10-01 NOTE — Progress Notes (Signed)
ANTICOAGULATION CONSULT NOTE  Pharmacy Consult for IV heparin Indication: DVT  No Known Allergies  Patient Measurements: Height: 5\' 9"  (175.3 cm) Weight: 220 lb 7.4 oz (100 kg) IBW/kg (Calculated) : 70.7 Heparin Dosing Weight: 79.3 kg   Vital Signs: Temp: 98.7 F (37.1 C) (03/27 0400) Temp Source: Oral (03/27 0400) BP: 120/67 (03/27 0600) Pulse Rate: 63 (03/27 0600)  Labs: Recent Labs    09/29/19 0404 09/29/19 10/01/19 09/29/19 1437 09/30/19 0127 09/30/19 0305 09/30/19 0813 09/30/19 1128 09/30/19 1732 09/30/19 1732 09/30/19 2051 10/01/19 0606  HGB   < >  --   --   --  13.5  --    < > 13.1   < > 12.5* 12.5*  HCT   < >  --   --   --  39.0  --    < > 37.5*  --  36.1* 36.3*  PLT   < >  --   --   --  151  --    < > 127*  --  125* 125*  HEPARINUNFRC   < >  --    < > 0.50 0.48  --   --   --   --  0.17* 0.34  CREATININE  --  1.09  --  1.34*  --  1.33*  --   --   --   --   --    < > = values in this interval not displayed.   Estimated Creatinine Clearance: 61.1 mL/min (A) (by C-G formula based on SCr of 1.33 mg/dL (H)).  Medical History: History reviewed. No pertinent past medical history.  Medications:  Scheduled:  . Chlorhexidine Gluconate Cloth  6 each Topical Daily  . sodium chloride flush  10-40 mL Intracatheter Q12H  . sodium chloride flush  3 mL Intravenous Q12H   Assessment: Pharmacy is consulted to dose heparin in 69 yo male diagnosed with DVT. 78 of lower right extremity shows findings consistent with acute deep vein thrombosis involving the right external iliac vein, common femoral vein, right femoral vein, and right proximal profunda vein. CT of chest also shows new bilateral pulmonary emboli as described without right heart strain.  The patient is s/p mechanical and TPA directed thrombolysis for the RLE earlier today. The patient continues on RLE catheter directed alteplase and heparin infusion. A heparin level this evening is SUBtherapeutic after a rate  decrease earlier today, fibrinogen is 271. No bleeding noted at this time.   3/27 AM update:  Heparin level therapeutic after rate increase  Goal of Therapy:  Heparin level 0.3-0.7 units/ml Monitor platelets by anticoagulation protocol: Yes   Plan:  -Cont heparin 1200 units/hr -1300 heparin level  4/27, PharmD, BCPS Clinical Pharmacist Phone: [phone]

## 2019-10-01 NOTE — Progress Notes (Signed)
ANTICOAGULATION CONSULT NOTE  Pharmacy Consult for IV heparin Indication: DVT  No Known Allergies  Patient Measurements: Height: 5\' 9"  (175.3 cm) Weight: 220 lb 7.4 oz (100 kg) IBW/kg (Calculated) : 70.7 Heparin Dosing Weight: 79.3 kg   Vital Signs: Temp: 98.5 F (36.9 C) (03/27 1600) Temp Source: Oral (03/27 1600) BP: 129/71 (03/27 1900) Pulse Rate: 70 (03/27 1900)  Labs: Recent Labs    09/30/19 0127 09/30/19 0305 09/30/19 0813 09/30/19 1128 09/30/19 1732 09/30/19 2051 09/30/19 2051 10/01/19 0606 10/01/19 1318 10/01/19 2120  HGB  --    < >  --    < > 13.1 12.5*  --  12.5*  --   --   HCT  --    < >  --    < > 37.5* 36.1*  --  36.3*  --   --   PLT  --    < >  --    < > 127* 125*  --  125*  --   --   HEPARINUNFRC 0.50   < >  --   --   --  0.17*   < > 0.34 0.32 0.46  CREATININE 1.34*  --  1.33*  --   --   --   --  1.17  --   --    < > = values in this interval not displayed.   Estimated Creatinine Clearance: 69.4 mL/min (by C-G formula based on SCr of 1.17 mg/dL).  Medical History: History reviewed. No pertinent past medical history.  Medications:  Scheduled:  . Chlorhexidine Gluconate Cloth  6 each Topical Daily  . sodium chloride flush  10-40 mL Intracatheter Q12H  . sodium chloride flush  3 mL Intravenous Q12H   Assessment: Pharmacy is consulted to dose heparin in 69 yo male diagnosed with DVT. 78 of lower right extremity shows findings consistent with acute deep vein thrombosis involving the right external iliac vein, common femoral vein, right femoral vein, and right proximal profunda vein. CT of chest also shows new bilateral pulmonary emboli as described without right heart strain.  The patient is s/p mechanical and TPA directed thrombolysis for the RLE earlier today. The patient continues on RLE catheter directed alteplase and heparin infusion. A heparin level this evening is SUBtherapeutic after a rate decrease earlier today, fibrinogen is 271. No bleeding  noted at this time.   3/27 PM update:  Heparin level therapeutic x 3 Alteplase off  Goal of Therapy:  Heparin level 0.3-0.7 units/ml Monitor platelets by anticoagulation protocol: Yes   Plan:  -Cont heparin 1300 units/hr -Daily CBC/HL -Monitor for bleeding  4/27, PharmD, BCPS Clinical Pharmacist Phone: [phone]

## 2019-10-01 NOTE — Procedures (Signed)
Interventional Radiology Procedure Note  Procedure: Follow up during venous thrombolytic therapy; right femoral vein angioplasty  Complications: None  Estimated Blood Loss: < 10 mL  Findings: Venography now shows open right popliteal, femoral, common femoral, external iliac and common iliac veins. Irregular narrowing throughout femoral vein likely on basis of some residual chronic thrombus and stenoses/webs.   Right femoral vein dilated with 8 mm and 9 mm angioplasty balloons with improved patency and improved antegrade flow.  Lysis discontinued and sheath removed.  Recommend:  - Continue IV heparin then transition to other long-term anticoagulation - Fit for compression stocking to decrease chronic edema of RLE - Will schedule patient for OP follow up RLE duplex US and IR Clinic follow up with Dr. Deanne Coffer in about 4 weeks.  Jodi Marble. Fredia Sorrow, M.D Pager:  [phone]

## 2019-10-01 NOTE — Progress Notes (Signed)
ANTICOAGULATION CONSULT NOTE  Pharmacy Consult for IV heparin Indication: DVT  No Known Allergies  Patient Measurements: Height: 5\' 9"  (175.3 cm) Weight: 220 lb 7.4 oz (100 kg) IBW/kg (Calculated) : 70.7 Heparin Dosing Weight: 79.3 kg   Vital Signs: Temp: 98.6 F (37 C) (03/27 1200) Temp Source: Oral (03/27 1200) BP: 125/72 (03/27 1300) Pulse Rate: 64 (03/27 1300)  Labs: Recent Labs    09/30/19 0127 09/30/19 0305 09/30/19 0813 09/30/19 1128 09/30/19 1732 09/30/19 2051 10/01/19 0606 10/01/19 1318  HGB  --    < >  --    < > 13.1 12.5* 12.5*  --   HCT  --    < >  --    < > 37.5* 36.1* 36.3*  --   PLT  --    < >  --    < > 127* 125* 125*  --   HEPARINUNFRC 0.50   < >  --   --   --  0.17* 0.34 0.32  CREATININE 1.34*  --  1.33*  --   --   --  1.17  --    < > = values in this interval not displayed.   Estimated Creatinine Clearance: 69.4 mL/min (by C-G formula based on SCr of 1.17 mg/dL).  Medical History: History reviewed. No pertinent past medical history.  Medications:  Scheduled:  . Chlorhexidine Gluconate Cloth  6 each Topical Daily  . fentaNYL      . midazolam      . sodium chloride flush  10-40 mL Intracatheter Q12H  . sodium chloride flush  3 mL Intravenous Q12H   Assessment: Pharmacy is consulted to dose heparin in 69 yo male diagnosed with DVT. 78 of lower right extremity shows findings consistent with acute deep vein thrombosis involving the right external iliac vein, common femoral vein, right femoral vein, and right proximal profunda vein. CT of chest also shows new bilateral pulmonary emboli as described without right heart strain.  The patient is s/p mechanical and TPA directed thrombolysis for the RLE earlier today. RLE catheter discontinued and directed alteplase  D/c'd. IR recommends the continued use of a heparin drip until patient is transitioned to a long-term AC.  A heparin level this is barely therapeutic at 0.32. No bleeding noted at this time.    Goal of Therapy:  Heparin level 0.3-0.7 units/ml Monitor platelets by anticoagulation protocol: Yes   Plan:  -Increase heparin to 1300 units/hr -2100 heparin level  2101, PharmD, [facility] Clinical Pharmacist Please see AMION for all Pharmacists' Contact Phone Numbers 10/01/2019, 1:54 PM

## 2019-10-01 NOTE — Progress Notes (Addendum)
PROGRESS NOTE    Cristian Huang  ZYY:[id number] DOB: 11/01/1950 DOA: 09/27/2019 PCP: Patient, No Pcp Per   Brief Narrative:  69 year old with no significant past medical history who presents complaining of right lower extremity edema.  He also reports some dyspnea with exertion and pain of his right leg on ambulation.  Patient was found to have DVT and PE.  Due to extension of the DVT IR was consulted for further evaluation and treatment for DVT.  Assessment & Plan:   Principal Problem:   Right leg DVT (HCC) Active Problems:   Pulmonary emboli (HCC)   AKI (acute kidney injury) (HCC)   Thrombocytopenia (HCC)  1-Right  lower extremity DVT, extensive DVT up to the external iliac vein Bilateral PE without RH strain on CT scan RLE [place] with acute DVT involving R external iliac vein, common femoral, R femoral, and R proximal profunda vein Continue heparin gtt S/p RLE venogram, mechanical thrombolysis, venous PTA, initiation of catheter directed TPA thrombolysis on 3/26 by IR Appreciate IR recs -> planning for follow up venography today  2-Thrombocytopenia: continue to monitor on heparin, stable  3-AKI/hyponatremia: Creatinine at presentation 1.42, improved to 1.09, now 1.33 Follow with IVF  # Hypertension: lisinopril on hold  4-Hyperbilirubinemia; improved  DVT prophylaxis: heparin gtt Code Status: full Family Communication: granddaughters at bedside 3/26 Disposition Plan:  . Patient came from: home            . Anticipated d/c place: home . Barriers to d/c OR conditions which need to be met to effect Adonai Selsor safe d/c: pending transition to PO meds, clearance for d/c by IR   Consultants:   IR  Procedures:  S/p RLE venogram, mechanical thrombolysis, venous PTA, initiation of catheter directed TPA thrombolysis on 3/26 by IR  Antimicrobials:  Anti-infectives (From admission, onward)   None     Subjective: No new complaints  Objective: Vitals:   10/01/19 0500  10/01/19 0600 10/01/19 0700 10/01/19 0800  BP: 114/70 120/67 114/70 122/70  Pulse: 62 63 (!) 55 61  Resp: 19 (!) 24 17 (!) 24  Temp:    98.6 F (37 C)  TempSrc:    Oral  SpO2: 94% 95% 97% 97%  Weight:      Height:        Intake/Output Summary (Last 24 hours) at 10/01/2019 0846 Last data filed at 10/01/2019 0800 Gross per 24 hour  Intake 2618.45 ml  Output 1825 ml  Net 793.45 ml   Filed Weights   09/27/19 1029 09/29/19 1701  Weight: 99.3 kg 100 kg    Examination:  General: No acute distress. Cardiovascular: Heart sounds show Viola Placeres regular rate, and rhythm.  Lungs: Clear to auscultation bilaterally  Abdomen: Soft, nontender, nondistended Neurological: Alert and oriented 3. Moves all extremities 4. Cranial nerves II through XII grossly intact. Skin: Warm and dry. No rashes or lesions. Extremities: RLE edema     Data Reviewed: I have personally reviewed following labs and imaging studies  CBC: Recent Labs  Lab 09/27/19 1126 09/28/19 0249 09/30/19 0305 09/30/19 1128 09/30/19 1732 09/30/19 2051 10/01/19 0606  WBC 7.9   < > 5.0 7.0 5.9 6.1 6.3  NEUTROABS 6.0  --   --   --   --   --   --   HGB 16.0   < > 13.5 13.6 13.1 12.5* 12.5*  HCT 48.2   < > 39.0 39.2 37.5* 36.1* 36.3*  MCV 97.0   < > 93.8 95.1 92.8 93.8 94.3  PLT 108*   < > 151 137* 127* 125* 125*   < > = values in this interval not displayed.   Basic Metabolic Panel: Recent Labs  Lab 09/28/19 0249 09/29/19 0712 09/30/19 0127 09/30/19 0813 10/01/19 0606  NA 134* 138 136 138 137  K 3.6 3.8 3.7 3.8 3.9  CL 103 108 105 107 105  CO2 21* _0 21*  GLUCOSE 131* 113* 121* 116* 105*  BUN 26* 26* 24* 25* 14  CREATININE 1.29* 1.09 1.34* 1.33* 1.17  CALCIUM 8.8* 8.7* 8.2* 8.3* 8.4*  MG  --   --  2.3  --  2.3  PHOS  --   --  3.8  --  3.7   GFR: Estimated Creatinine Clearance: 69.4 mL/min (by C-G formula based on SCr of 1.17 mg/dL). Liver Function Tests: Recent Labs  Lab 09/27/19 1126 09/30/19 0127  10/01/19 0606  AST 36 29 50*  ALT 39 29 31  ALKPHOS 68 58 53  BILITOT 1.8* 1.1 1.4*  PROT 8.2* 6.0* 5.7*  ALBUMIN 4.5 3.1* 2.9*   No results for input(s): LIPASE, AMYLASE in the last 168 hours. No results for input(s): AMMONIA in the last 168 hours. Coagulation Profile: No results for input(s): INR, PROTIME in the last 168 hours. Cardiac Enzymes: No results for input(s): CKTOTAL, CKMB, CKMBINDEX, TROPONINI in the last 168 hours. BNP (last 3 results) No results for input(s): PROBNP in the last 8760 hours. HbA1C: No results for input(s): HGBA1C in the last 72 hours. CBG: No results for input(s): GLUCAP in the last 168 hours. Lipid Profile: No results for input(s): CHOL, HDL, LDLCALC, TRIG, CHOLHDL, LDLDIRECT in the last 72 hours. Thyroid Function Tests: No results for input(s): TSH, T4TOTAL, FREET4, T3FREE, THYROIDAB in the last 72 hours. Anemia Panel: No results for input(s): VITAMINB12, FOLATE, FERRITIN, TIBC, IRON, RETICCTPCT in the last 72 hours. Sepsis Labs: No results for input(s): PROCALCITON, LATICACIDVEN in the last 168 hours.  Recent Results (from the past 240 hour(s))  Respiratory Panel by RT PCR (Flu Sherrie Marsan&B, Covid) - Nasopharyngeal Swab     Status: None   Collection Time: 09/27/19  1:38 PM   Specimen: Nasopharyngeal Swab  Result Value Ref Range Status   SARS Coronavirus 2 by RT PCR NEGATIVE NEGATIVE Final    Comment: (NOTE) SARS-CoV-2 target nucleic acids are NOT DETECTED. The SARS-CoV-2 RNA is generally detectable in upper respiratoy specimens during the acute phase of infection. The lowest concentration of SARS-CoV-2 viral copies this assay can detect is 131 copies/mL. Xan Sparkman negative result does not preclude SARS-Cov-2 infection and should not be used as the sole basis for treatment or other patient management decisions. Maiana Hennigan negative result may occur with  improper specimen collection/handling, submission of specimen other than nasopharyngeal swab, presence of viral  mutation(s) within the areas targeted by this assay, and inadequate number of viral copies (<131 copies/mL). Syble Picco negative result must be combined with clinical observations, patient history, and epidemiological information. The expected result is Negative. Fact Sheet for Patients:  PinkCheek.be Fact Sheet for Healthcare Providers:  GravelBags.it This test is not yet ap proved or cleared by the [place] FDA and  has been authorized for detection and/or diagnosis of SARS-CoV-2 by FDA under an Emergency Use Authorization (EUA). This EUA will remain  in effect (meaning this test can be used) for the duration of the COVID-19 declaration under Section 564(b)(1) of the Act, 21 U.S.C. section 360bbb-3(b)(1), unless the authorization is terminated or revoked sooner.  Influenza Zemirah Krasinski by PCR NEGATIVE NEGATIVE Final   Influenza B by PCR NEGATIVE NEGATIVE Final    Comment: (NOTE) The Xpert Xpress SARS-CoV-2/FLU/RSV assay is intended as an aid in  the diagnosis of influenza from Nasopharyngeal swab specimens and  should not be used as Hurley Sobel sole basis for treatment. Nasal washings and  aspirates are unacceptable for Xpert Xpress SARS-CoV-2/FLU/RSV  testing. Fact Sheet for Patients: PinkCheek.be Fact Sheet for Healthcare Providers: GravelBags.it This test is not yet approved or cleared by the [place] FDA and  has been authorized for detection and/or diagnosis of SARS-CoV-2 by  FDA under an Emergency Use Authorization (EUA). This EUA will remain  in effect (meaning this test can be used) for the duration of the  Covid-19 declaration under Section 564(b)(1) of the Act, 21  U.S.C. section 360bbb-3(b)(1), unless the authorization is  terminated or revoked. Performed at [facility], [place] [address] [place], [place] [address]   MRSA PCR Screening     Status: None     Collection Time: 09/29/19  4:58 PM   Specimen: Nasal Mucosa; Nasopharyngeal  Result Value Ref Range Status   MRSA by PCR NEGATIVE NEGATIVE Final    Comment:        The GeneXpert MRSA Assay (FDA approved for NASAL specimens only), is one component of Calysta Craigo comprehensive MRSA colonization surveillance program. It is not intended to diagnose MRSA infection nor to guide or monitor treatment for MRSA infections. Performed at [facility], [place] [address], [place], [place] [address]          Radiology Studies: IR Veno/Ext/Uni Right  Result Date: 09/30/2019 CLINICAL DATA:  Right lower extremity swelling and pain. Recent diagnosis of acute pulmonary emboli. Venography and ultrasound demonstrate right iliofemoral occlusive DVT. EXAM: THROMBOECTOMY MECHANICAL VENOUS; RIGHT EXTREMITY VENOGRAPHY; INFERIOR VENA CAVOGRAM; IR INFUSION THROMBOL VENOUS INITIAL (MS); IR ULTRASOUND GUIDANCE VASC ACCESS RIGHT; IR PTA VENOUS EXCEPT DIALYSIS CIRCUIT ANESTHESIA/SEDATION: Intravenous Fentanyl 5mg and Versed 1.'5mg'$  were administered as conscious sedation during continuous monitoring of the patient's level of consciousness and physiological / cardiorespiratory status by the radiology RN, with Juliani Laduke total moderate sedation time of 48 minutes. MEDICATIONS: Lidocaine 1% subcutaneous CONTRAST:  793mOMNIPAQUE IOHEXOL 300 MG/ML  SOLN PROCEDURE: The procedure, risks (including but not limited to bleeding, infection, organ damage ), benefits, and alternatives were explained to the patient. Questions regarding the procedure were encouraged and answered. The patient understands and consents to the procedure. Patient placed prone on the procedure table. Right leg prepped and draped in usual sterile fashion. Maximal barrier sterile technique was utilized including caps, mask, sterile gowns, sterile gloves, sterile drape, hand hygiene and skin antiseptic. After time-out, moderate sedation was initiated. After the skin was  infiltrated with lidocaine, Sentoria Brent right posterior tibial vein was accessed under ultrasound guidance with Itzae Mccurdy 21 gauge micropuncture set at the proximal calf level. Venography was performed. The micropuncture dilator was exchanged for an 8 FrPakistanascular sheath, through which Lynk Marti 5 FrPakistanumpe catheter was advanced for right lower extremity venography. The catheter was exchanged over Darcie Mellone guidewire for the BoHawkeyeelanteDVT 105cm x 8Fdevice, used for pulse spray mechanical thrombolysis of iliofemoral DVT using 5 mg tPA in 100 mL sterile saline. The device was subsequently used for mechanical thrombectomy of the iliofemoral DVT . Intermittent contrast injections were utilized to guide mechanical thrombo lysis. The device was then exchanged over the guidewire for Kuuipo Anzaldo 12 mm x 4 cm atlas angioplasty balloon, used to  macerate the thrombus through the right common femoral, external and common iliac veins. No high-grade stenosis was encountered. Antegrade flow was achieved, with moderate residual mural thrombus. 0 90/50 cm infusion catheter was then placed across regions of residual mural thrombus and catheter directed thrombo lytic infusion was initiated. The catheter and sheath were secured with an 0 silk suture and Steri-Strips the patient transferred to ICU for overnight infusion. The patient tolerated the procedure well. COMPLICATIONS: None immediate FINDINGS: Initial right lower extremity venography demonstrated occlusive DVT extending through the length of the right superficial femoral, common femoral, external iliac, and common iliac veins. IVC widely patent without thrombus. There was incomplete clearance of thrombus from the femoral and iliac veins with mechanical thrombolysis. The central thrombus responded easily to 12 mm balloon angioplasty. Antegrade flow through the length of the affected segment was restored, with scattered residual mural thrombus. Catheter directed pharmacologic thrombolysis was  initiated. IMPRESSION: 1. Extensive occlusive acute thrombus through the right iliofemoral venous system. 2. Partial response to pulse spray mechanical thrombolysis and venous PTA, with restoration of flow through the affected segments. 3. Initiation of catheter directed thrombolytic infusion. PLAN: Overnight infusion with ICU observation. Follow-up venography in [place].m. Electronically Signed   By: Lucrezia Europe M.D.   On: 09/30/2019 15:48   IR Venocavagram Ivc  Result Date: 09/30/2019 CLINICAL DATA:  Right lower extremity swelling and pain. Recent diagnosis of acute pulmonary emboli. Venography and ultrasound demonstrate right iliofemoral occlusive DVT. EXAM: THROMBOECTOMY MECHANICAL VENOUS; RIGHT EXTREMITY VENOGRAPHY; INFERIOR VENA CAVOGRAM; IR INFUSION THROMBOL VENOUS INITIAL (MS); IR ULTRASOUND GUIDANCE VASC ACCESS RIGHT; IR PTA VENOUS EXCEPT DIALYSIS CIRCUIT ANESTHESIA/SEDATION: Intravenous Fentanyl 22mg and Versed 1.'5mg'$  were administered as conscious sedation during continuous monitoring of the patient's level of consciousness and physiological / cardiorespiratory status by the radiology RN, with Samyria Rudie total moderate sedation time of 48 minutes. MEDICATIONS: Lidocaine 1% subcutaneous CONTRAST:  775mOMNIPAQUE IOHEXOL 300 MG/ML  SOLN PROCEDURE: The procedure, risks (including but not limited to bleeding, infection, organ damage ), benefits, and alternatives were explained to the patient. Questions regarding the procedure were encouraged and answered. The patient understands and consents to the procedure. Patient placed prone on the procedure table. Right leg prepped and draped in usual sterile fashion. Maximal barrier sterile technique was utilized including caps, mask, sterile gowns, sterile gloves, sterile drape, hand hygiene and skin antiseptic. After time-out, moderate sedation was initiated. After the skin was infiltrated with lidocaine, Adekunle Rohrbach right posterior tibial vein was accessed under ultrasound guidance with  Toria Monte 21 gauge micropuncture set at the proximal calf level. Venography was performed. The micropuncture dilator was exchanged for an 8 FrPakistanascular sheath, through which Kyzen Horn 5 FrPakistanumpe catheter was advanced for right lower extremity venography. The catheter was exchanged over Akash Winski guidewire for the BoNeihartelanteDVT 105cm x 8Fdevice, used for pulse spray mechanical thrombolysis of iliofemoral DVT using 5 mg tPA in 100 mL sterile saline. The device was subsequently used for mechanical thrombectomy of the iliofemoral DVT . Intermittent contrast injections were utilized to guide mechanical thrombo lysis. The device was then exchanged over the guidewire for Deven Audi 12 mm x 4 cm atlas angioplasty balloon, used to macerate the thrombus through the right common femoral, external and common iliac veins. No high-grade stenosis was encountered. Antegrade flow was achieved, with moderate residual mural thrombus. 0 90/50 cm infusion catheter was then placed across regions of residual mural thrombus and catheter directed thrombo lytic infusion was initiated. The catheter and sheath were  secured with an 0 silk suture and Steri-Strips the patient transferred to ICU for overnight infusion. The patient tolerated the procedure well. COMPLICATIONS: None immediate FINDINGS: Initial right lower extremity venography demonstrated occlusive DVT extending through the length of the right superficial femoral, common femoral, external iliac, and common iliac veins. IVC widely patent without thrombus. There was incomplete clearance of thrombus from the femoral and iliac veins with mechanical thrombolysis. The central thrombus responded easily to 12 mm balloon angioplasty. Antegrade flow through the length of the affected segment was restored, with scattered residual mural thrombus. Catheter directed pharmacologic thrombolysis was initiated. IMPRESSION: 1. Extensive occlusive acute thrombus through the right iliofemoral venous  system. 2. Partial response to pulse spray mechanical thrombolysis and venous PTA, with restoration of flow through the affected segments. 3. Initiation of catheter directed thrombolytic infusion. PLAN: Overnight infusion with ICU observation. Follow-up venography in [place].m. Electronically Signed   By: Lucrezia Europe M.D.   On: 09/30/2019 15:48   IR THROMBECT VENO MECH MOD SED  Result Date: 09/30/2019 CLINICAL DATA:  Right lower extremity swelling and pain. Recent diagnosis of acute pulmonary emboli. Venography and ultrasound demonstrate right iliofemoral occlusive DVT. EXAM: THROMBOECTOMY MECHANICAL VENOUS; RIGHT EXTREMITY VENOGRAPHY; INFERIOR VENA CAVOGRAM; IR INFUSION THROMBOL VENOUS INITIAL (MS); IR ULTRASOUND GUIDANCE VASC ACCESS RIGHT; IR PTA VENOUS EXCEPT DIALYSIS CIRCUIT ANESTHESIA/SEDATION: Intravenous Fentanyl 52mg and Versed 1.'5mg'$  were administered as conscious sedation during continuous monitoring of the patient's level of consciousness and physiological / cardiorespiratory status by the radiology RN, with Kollin Udell total moderate sedation time of 48 minutes. MEDICATIONS: Lidocaine 1% subcutaneous CONTRAST:  729mOMNIPAQUE IOHEXOL 300 MG/ML  SOLN PROCEDURE: The procedure, risks (including but not limited to bleeding, infection, organ damage ), benefits, and alternatives were explained to the patient. Questions regarding the procedure were encouraged and answered. The patient understands and consents to the procedure. Patient placed prone on the procedure table. Right leg prepped and draped in usual sterile fashion. Maximal barrier sterile technique was utilized including caps, mask, sterile gowns, sterile gloves, sterile drape, hand hygiene and skin antiseptic. After time-out, moderate sedation was initiated. After the skin was infiltrated with lidocaine, Karlie Aung right posterior tibial vein was accessed under ultrasound guidance with Serene Kopf 21 gauge micropuncture set at the proximal calf level. Venography was performed. The  micropuncture dilator was exchanged for an 8 FrPakistanascular sheath, through which Annaleigha Woo 5 FrPakistanumpe catheter was advanced for right lower extremity venography. The catheter was exchanged over Sorcha Rotunno guidewire for the BoDolandelanteDVT 105cm x 8Fdevice, used for pulse spray mechanical thrombolysis of iliofemoral DVT using 5 mg tPA in 100 mL sterile saline. The device was subsequently used for mechanical thrombectomy of the iliofemoral DVT . Intermittent contrast injections were utilized to guide mechanical thrombo lysis. The device was then exchanged over the guidewire for Lesbia Ottaway 12 mm x 4 cm atlas angioplasty balloon, used to macerate the thrombus through the right common femoral, external and common iliac veins. No high-grade stenosis was encountered. Antegrade flow was achieved, with moderate residual mural thrombus. 0 90/50 cm infusion catheter was then placed across regions of residual mural thrombus and catheter directed thrombo lytic infusion was initiated. The catheter and sheath were secured with an 0 silk suture and Steri-Strips the patient transferred to ICU for overnight infusion. The patient tolerated the procedure well. COMPLICATIONS: None immediate FINDINGS: Initial right lower extremity venography demonstrated occlusive DVT extending through the length of the right superficial femoral, common femoral, external iliac, and common iliac veins.  IVC widely patent without thrombus. There was incomplete clearance of thrombus from the femoral and iliac veins with mechanical thrombolysis. The central thrombus responded easily to 12 mm balloon angioplasty. Antegrade flow through the length of the affected segment was restored, with scattered residual mural thrombus. Catheter directed pharmacologic thrombolysis was initiated. IMPRESSION: 1. Extensive occlusive acute thrombus through the right iliofemoral venous system. 2. Partial response to pulse spray mechanical thrombolysis and venous PTA, with  restoration of flow through the affected segments. 3. Initiation of catheter directed thrombolytic infusion. PLAN: Overnight infusion with ICU observation. Follow-up venography in [place].m. Electronically Signed   By: Lucrezia Europe M.D.   On: 09/30/2019 15:48   IR US Guide Vasc Access Right  Result Date: 09/30/2019 CLINICAL DATA:  Right lower extremity swelling and pain. Recent diagnosis of acute pulmonary emboli. Venography and ultrasound demonstrate right iliofemoral occlusive DVT. EXAM: THROMBOECTOMY MECHANICAL VENOUS; RIGHT EXTREMITY VENOGRAPHY; INFERIOR VENA CAVOGRAM; IR INFUSION THROMBOL VENOUS INITIAL (MS); IR ULTRASOUND GUIDANCE VASC ACCESS RIGHT; IR PTA VENOUS EXCEPT DIALYSIS CIRCUIT ANESTHESIA/SEDATION: Intravenous Fentanyl 69mg and Versed 1.535mwere administered as conscious sedation during continuous monitoring of the patient's level of consciousness and physiological / cardiorespiratory status by the radiology RN, with Manolito Jurewicz total moderate sedation time of 48 minutes. MEDICATIONS: Lidocaine 1% subcutaneous CONTRAST:  708mMNIPAQUE IOHEXOL 300 MG/ML  SOLN PROCEDURE: The procedure, risks (including but not limited to bleeding, infection, organ damage ), benefits, and alternatives were explained to the patient. Questions regarding the procedure were encouraged and answered. The patient understands and consents to the procedure. Patient placed prone on the procedure table. Right leg prepped and draped in usual sterile fashion. Maximal barrier sterile technique was utilized including caps, mask, sterile gowns, sterile gloves, sterile drape, hand hygiene and skin antiseptic. After time-out, moderate sedation was initiated. After the skin was infiltrated with lidocaine, Lillie Portner right posterior tibial vein was accessed under ultrasound guidance with Elois Averitt 21 gauge micropuncture set at the proximal calf level. Venography was performed. The micropuncture dilator was exchanged for an 8 FrePakistanscular sheath, through which Jaimen Melone 5  FrePakistanmpe catheter was advanced for right lower extremity venography. The catheter was exchanged over Yakir Wenke guidewire for the BosLymanlanteDVT 105cm x 8Fdevice, used for pulse spray mechanical thrombolysis of iliofemoral DVT using 5 mg tPA in 100 mL sterile saline. The device was subsequently used for mechanical thrombectomy of the iliofemoral DVT . Intermittent contrast injections were utilized to guide mechanical thrombo lysis. The device was then exchanged over the guidewire for Santhosh Gulino 12 mm x 4 cm atlas angioplasty balloon, used to macerate the thrombus through the right common femoral, external and common iliac veins. No high-grade stenosis was encountered. Antegrade flow was achieved, with moderate residual mural thrombus. 0 90/50 cm infusion catheter was then placed across regions of residual mural thrombus and catheter directed thrombo lytic infusion was initiated. The catheter and sheath were secured with an 0 silk suture and Steri-Strips the patient transferred to ICU for overnight infusion. The patient tolerated the procedure well. COMPLICATIONS: None immediate FINDINGS: Initial right lower extremity venography demonstrated occlusive DVT extending through the length of the right superficial femoral, common femoral, external iliac, and common iliac veins. IVC widely patent without thrombus. There was incomplete clearance of thrombus from the femoral and iliac veins with mechanical thrombolysis. The central thrombus responded easily to 12 mm balloon angioplasty. Antegrade flow through the length of the affected segment was restored, with scattered residual mural thrombus. Catheter directed pharmacologic thrombolysis was  initiated. IMPRESSION: 1. Extensive occlusive acute thrombus through the right iliofemoral venous system. 2. Partial response to pulse spray mechanical thrombolysis and venous PTA, with restoration of flow through the affected segments. 3. Initiation of catheter directed  thrombolytic infusion. PLAN: Overnight infusion with ICU observation. Follow-up venography in [place].m. Electronically Signed   By: Lucrezia Europe M.D.   On: 09/30/2019 15:48   IR PTA VENOUS EXCEPT DIALYSIS CIRCUIT  Result Date: 09/30/2019 CLINICAL DATA:  Right lower extremity swelling and pain. Recent diagnosis of acute pulmonary emboli. Venography and ultrasound demonstrate right iliofemoral occlusive DVT. EXAM: THROMBOECTOMY MECHANICAL VENOUS; RIGHT EXTREMITY VENOGRAPHY; INFERIOR VENA CAVOGRAM; IR INFUSION THROMBOL VENOUS INITIAL (MS); IR ULTRASOUND GUIDANCE VASC ACCESS RIGHT; IR PTA VENOUS EXCEPT DIALYSIS CIRCUIT ANESTHESIA/SEDATION: Intravenous Fentanyl 25mg and Versed 1.'5mg'$  were administered as conscious sedation during continuous monitoring of the patient's level of consciousness and physiological / cardiorespiratory status by the radiology RN, with Millicent Blazejewski total moderate sedation time of 48 minutes. MEDICATIONS: Lidocaine 1% subcutaneous CONTRAST:  710mOMNIPAQUE IOHEXOL 300 MG/ML  SOLN PROCEDURE: The procedure, risks (including but not limited to bleeding, infection, organ damage ), benefits, and alternatives were explained to the patient. Questions regarding the procedure were encouraged and answered. The patient understands and consents to the procedure. Patient placed prone on the procedure table. Right leg prepped and draped in usual sterile fashion. Maximal barrier sterile technique was utilized including caps, mask, sterile gowns, sterile gloves, sterile drape, hand hygiene and skin antiseptic. After time-out, moderate sedation was initiated. After the skin was infiltrated with lidocaine, Trent Theisen right posterior tibial vein was accessed under ultrasound guidance with Jalin Alicea 21 gauge micropuncture set at the proximal calf level. Venography was performed. The micropuncture dilator was exchanged for an 8 FrPak[place]ascular sheath, through which Ashiyah Pavlak 5 FrPakistanumpe catheter was advanced for right lower extremity venography. The  catheter was exchanged over Avondre Richens guidewire for the BoWaterlooelanteDVT 105cm x 8Fdevice, used for pulse spray mechanical thrombolysis of iliofemoral DVT using 5 mg tPA in 100 mL sterile saline. The device was subsequently used for mechanical thrombectomy of the iliofemoral DVT . Intermittent contrast injections were utilized to guide mechanical thrombo lysis. The device was then exchanged over the guidewire for Kapono Luhn 12 mm x 4 cm atlas angioplasty balloon, used to macerate the thrombus through the right common femoral, external and common iliac veins. No high-grade stenosis was encountered. Antegrade flow was achieved, with moderate residual mural thrombus. 0 90/50 cm infusion catheter was then placed across regions of residual mural thrombus and catheter directed thrombo lytic infusion was initiated. The catheter and sheath were secured with an 0 silk suture and Steri-Strips the patient transferred to ICU for overnight infusion. The patient tolerated the procedure well. COMPLICATIONS: None immediate FINDINGS: Initial right lower extremity venography demonstrated occlusive DVT extending through the length of the right superficial femoral, common femoral, external iliac, and common iliac veins. IVC widely patent without thrombus. There was incomplete clearance of thrombus from the femoral and iliac veins with mechanical thrombolysis. The central thrombus responded easily to 12 mm balloon angioplasty. Antegrade flow through the length of the affected segment was restored, with scattered residual mural thrombus. Catheter directed pharmacologic thrombolysis was initiated. IMPRESSION: 1. Extensive occlusive acute thrombus through the right iliofemoral venous system. 2. Partial response to pulse spray mechanical thrombolysis and venous PTA, with restoration of flow through the affected segments. 3. Initiation of catheter directed thrombolytic infusion. PLAN: Overnight infusion with ICU observation. Follow-up  venography in [facility]. Electronically  Signed   By: Lucrezia Europe M.D.   On: 09/30/2019 15:48   IR INFUSION THROMBOL VENOUS INITIAL (MS)  Result Date: 09/30/2019 CLINICAL DATA:  Right lower extremity swelling and pain. Recent diagnosis of acute pulmonary emboli. Venography and ultrasound demonstrate right iliofemoral occlusive DVT. EXAM: THROMBOECTOMY MECHANICAL VENOUS; RIGHT EXTREMITY VENOGRAPHY; INFERIOR VENA CAVOGRAM; IR INFUSION THROMBOL VENOUS INITIAL (MS); IR ULTRASOUND GUIDANCE VASC ACCESS RIGHT; IR PTA VENOUS EXCEPT DIALYSIS CIRCUIT ANESTHESIA/SEDATION: Intravenous Fentanyl 45mg and Versed 1.584mwere administered as conscious sedation during continuous monitoring of the patient's level of consciousness and physiological / cardiorespiratory status by the radiology RN, with Olaoluwa Grieder total moderate sedation time of 48 minutes. MEDICATIONS: Lidocaine 1% subcutaneous CONTRAST:  7050mMNIPAQUE IOHEXOL 300 MG/ML  SOLN PROCEDURE: The procedure, risks (including but not limited to bleeding, infection, organ damage ), benefits, and alternatives were explained to the patient. Questions regarding the procedure were encouraged and answered. The patient understands and consents to the procedure. Patient placed prone on the procedure table. Right leg prepped and draped in usual sterile fashion. Maximal barrier sterile technique was utilized including caps, mask, sterile gowns, sterile gloves, sterile drape, hand hygiene and skin antiseptic. After time-out, moderate sedation was initiated. After the skin was infiltrated with lidocaine, Tamyrah Burbage right posterior tibial vein was accessed under ultrasound guidance with Lance Galas 21 gauge micropuncture set at the proximal calf level. Venography was performed. The micropuncture dilator was exchanged for an 8 FrePakistanscular sheath, through which Raneen Jaffer 5 FrePakistanmpe catheter was advanced for right lower extremity venography. The catheter was exchanged over Makaelah Cranfield guidewire for the BosFort MadisonelanteDVT 105cm x 8Fdevice, used for pulse spray mechanical thrombolysis of iliofemoral DVT using 5 mg tPA in 100 mL sterile saline. The device was subsequently used for mechanical thrombectomy of the iliofemoral DVT . Intermittent contrast injections were utilized to guide mechanical thrombo lysis. The device was then exchanged over the guidewire for Brayon Bielefeld 12 mm x 4 cm atlas angioplasty balloon, used to macerate the thrombus through the right common femoral, external and common iliac veins. No high-grade stenosis was encountered. Antegrade flow was achieved, with moderate residual mural thrombus. 0 90/50 cm infusion catheter was then placed across regions of residual mural thrombus and catheter directed thrombo lytic infusion was initiated. The catheter and sheath were secured with an 0 silk suture and Steri-Strips the patient transferred to ICU for overnight infusion. The patient tolerated the procedure well. COMPLICATIONS: None immediate FINDINGS: Initial right lower extremity venography demonstrated occlusive DVT extending through the length of the right superficial femoral, common femoral, external iliac, and common iliac veins. IVC widely patent without thrombus. There was incomplete clearance of thrombus from the femoral and iliac veins with mechanical thrombolysis. The central thrombus responded easily to 12 mm balloon angioplasty. Antegrade flow through the length of the affected segment was restored, with scattered residual mural thrombus. Catheter directed pharmacologic thrombolysis was initiated. IMPRESSION: 1. Extensive occlusive acute thrombus through the right iliofemoral venous system. 2. Partial response to pulse spray mechanical thrombolysis and venous PTA, with restoration of flow through the affected segments. 3. Initiation of catheter directed thrombolytic infusion. PLAN: Overnight infusion with ICU observation. Follow-up venography in [facility]. Electronically Signed   By: D  Lucrezia EuropeD.   On:  09/30/2019 15:48        Scheduled Meds: . Chlorhexidine Gluconate Cloth  6 each Topical Daily  . sodium chloride flush  10-40 mL Intracatheter Q12H  . sodium chloride flush  3 mL Intravenous Q12H  Continuous Infusions: . sodium chloride 10 mL/hr at 10/01/19 0800  . alteplase (LIMB ISCHEMIA) 10 mg in normal saline (0.02 mg/mL) infusion 0.4 mg/hr (10/01/19 0800)  . heparin 1,200 Units/hr (10/01/19 0800)  . lactated ringers 100 mL/hr at 10/01/19 0800     LOS: 4 days    Time spent: over 30 min    Fayrene Helper, MD [facility]   To contact the attending provider between 7A-7P or the covering provider during after hours 7P-7A, please log into the web site [email or web address] and access using universal [place] password for that web site. If you do not have the password, please call the hospital operator.  10/01/2019, 8:46 AM

## 2019-10-02 ENCOUNTER — Inpatient Hospital Stay (HOSPITAL_COMMUNITY): Payer: Federal, State, Local not specified - PPO

## 2019-10-02 DIAGNOSIS — I2699 Other pulmonary embolism without acute cor pulmonale: Secondary | ICD-10-CM | POA: Diagnosis not present

## 2019-10-02 DIAGNOSIS — R0602 Shortness of breath: Secondary | ICD-10-CM

## 2019-10-02 LAB — COMPREHENSIVE METABOLIC PANEL
ALT: 31 U/L (ref 0–44)
AST: 33 U/L (ref 15–41)
Albumin: 2.9 g/dL — ABNORMAL LOW (ref 3.5–5.0)
Alkaline Phosphatase: 56 U/L (ref 38–126)
Anion gap: 7 (ref 5–15)
BUN: 12 mg/dL (ref 8–23)
CO2: 23 mmol/L (ref 22–32)
Calcium: 8.5 mg/dL — ABNORMAL LOW (ref 8.9–10.3)
Chloride: 108 mmol/L (ref 98–111)
Creatinine, Ser: 0.99 mg/dL (ref 0.61–1.24)
GFR calc Af Amer: >60 mL/min (ref >60)
GFR calc non Af Amer: >60 mL/min (ref >60)
Glucose, Bld: 112 mg/dL — ABNORMAL HIGH (ref 70–99)
Potassium: 3.9 mmol/L (ref 3.5–5.1)
Sodium: 138 mmol/L (ref 135–145)
Total Bilirubin: 1.1 mg/dL (ref 0.3–1.2)
Total Protein: 5.7 g/dL — ABNORMAL LOW (ref 6.5–8.1)

## 2019-10-02 LAB — CBC
HCT: 35.9 % — ABNORMAL LOW (ref 39.0–52.0)
Hemoglobin: 12.3 g/dL — ABNORMAL LOW (ref 13.0–17.0)
MCH: 32.5 pg (ref 26.0–34.0)
MCHC: 34.3 g/dL (ref 30.0–36.0)
MCV: 94.7 fL (ref 80.0–100.0)
Platelets: 134 10*3/uL — ABNORMAL LOW (ref 150–400)
RBC: 3.79 MIL/uL — ABNORMAL LOW (ref 4.22–5.81)
RDW: 12.6 % (ref 11.5–15.5)
WBC: 5 10*3/uL (ref 4.0–10.5)
nRBC: 0 % (ref 0.0–0.2)

## 2019-10-02 LAB — ECHOCARDIOGRAM COMPLETE
Height: 69 in
Weight: 3527.36 oz

## 2019-10-02 LAB — HEPARIN LEVEL (UNFRACTIONATED): Heparin Unfractionated: 0.52 IU/mL (ref 0.30–0.70)

## 2019-10-02 LAB — MAGNESIUM: Magnesium: 2.4 mg/dL (ref 1.7–2.4)

## 2019-10-02 LAB — PHOSPHORUS: Phosphorus: 3.7 mg/dL (ref 2.5–4.6)

## 2019-10-02 MED ORDER — APIXABAN 5 MG PO TABS
10.0000 mg | ORAL_TABLET | Freq: Two times a day (BID) | ORAL | Status: DC
  Administered 2019-10-02: 10:00:00 10 mg via ORAL
  Filled 2019-10-02: qty 2

## 2019-10-02 MED ORDER — APIXABAN 5 MG PO TABS: 5.0000 mg | ORAL_TABLET | Freq: Two times a day (BID) | ORAL | 0 refills | Status: AC

## 2019-10-02 MED ORDER — APIXABAN 5 MG PO TABS: 10.0000 mg | ORAL_TABLET | Freq: Two times a day (BID) | ORAL | 0 refills | Status: AC

## 2019-10-02 MED ORDER — APIXABAN 5 MG PO TABS: 5.0000 mg | ORAL_TABLET | Freq: Two times a day (BID) | ORAL | Status: DC

## 2019-10-02 NOTE — Care Management (Signed)
Patient given Eliquis 30 day free and copay card.  Copay for first 12 tabs is $21.  Patient given [facility] number to find PCP and states he knows how to check with insurance company for eligible providers.  He will call ASAP and understands that he needs PCP to refill Eliquis and other meds.

## 2019-10-02 NOTE — Discharge Instructions (Signed)
Information on my medicine - ELIQUIS (apixaban)  This medication education was reviewed with me or my healthcare representative as part of my discharge preparation.    Why was Eliquis prescribed for you? Eliquis was prescribed to treat blood clots that may have been found in the veins of your legs (deep vein thrombosis) or in your lungs (pulmonary embolism) and to reduce the risk of them occurring again.  What do You need to know about Eliquis ? The starting dose is 10 mg (two 5 mg tablets) taken TWICE daily for the FIRST THREE (3) DAYS, then on 10/05/19  the dose is reduced to ONE 5 mg tablet taken TWICE daily.  Eliquis may be taken with or without food.   Try to take the dose about the same time in the morning and in the evening. If you have difficulty swallowing the tablet whole please discuss with your pharmacist how to take the medication safely.  Take Eliquis exactly as prescribed and DO NOT stop taking Eliquis without talking to the doctor who prescribed the medication.  Stopping may increase your risk of developing a new blood clot.  Refill your prescription before you run out.  After discharge, you should have regular check-up appointments with your healthcare provider that is prescribing your Eliquis.    What do you do if you miss a dose? If a dose of ELIQUIS is not taken at the scheduled time, take it as soon as possible on the same day and twice-daily administration should be resumed. The dose should not be doubled to make up for a missed dose.  Important Safety Information A possible side effect of Eliquis is bleeding. You should call your healthcare provider right away if you experience any of the following: ? Bleeding from an injury or your nose that does not stop. ? Unusual colored urine (red or dark brown) or unusual colored stools (red or black). ? Unusual bruising for unknown reasons. ? A serious fall or if you hit your head (even if there is no bleeding).  Some  medicines may interact with Eliquis and might increase your risk of bleeding or clotting while on Eliquis. To help avoid this, consult your healthcare provider or pharmacist prior to using any new prescription or non-prescription medications, including herbals, vitamins, non-steroidal anti-inflammatory drugs (NSAIDs) and supplements.  This website has more information on Eliquis (apixaban): [email or web address]://[email or web address]

## 2019-10-02 NOTE — Progress Notes (Signed)
ANTICOAGULATION CONSULT NOTE  Pharmacy Consult for IV heparin transition to Apixaban Indication: DVT  No Known Allergies  Patient Measurements: Height: 5\' 9"  (175.3 cm) Weight: 220 lb 7.4 oz (100 kg) IBW/kg (Calculated) : 70.7 Heparin Dosing Weight: 79.3 kg   Vital Signs: Temp: 98.4 F (36.9 C) (03/28 0752) Temp Source: Oral (03/28 0752) BP: 118/77 (03/28 0752) Pulse Rate: 56 (03/28 0752)  Labs: Recent Labs    09/30/19 0813 09/30/19 1128 09/30/19 2051 09/30/19 2051 10/01/19 0606 10/01/19 0606 10/01/19 1318 10/01/19 2120 10/02/19 0530  HGB  --    < > 12.5*   < > 12.5*  --   --   --  12.3*  HCT  --    < > 36.1*  --  36.3*  --   --   --  35.9*  PLT  --    < > 125*  --  125*  --   --   --  134*  HEPARINUNFRC  --   --  0.17*   < > 0.34   < > 0.32 0.46 0.52  CREATININE 1.33*  --   --   --  1.17  --   --   --  0.99   < > = values in this interval not displayed.   Estimated Creatinine Clearance: 82.1 mL/min (by C-G formula based on SCr of 0.99 mg/dL).  Medical History: History reviewed. No pertinent past medical history.  Medications:  Scheduled:  . apixaban  10 mg Oral BID   Followed by  . [START ON 10/05/2019] apixaban  5 mg Oral BID  . Chlorhexidine Gluconate Cloth  6 each Topical Daily  . sodium chloride flush  10-40 mL Intracatheter Q12H  . sodium chloride flush  3 mL Intravenous Q12H   Assessment: Pharmacy is consulted to dose heparin in 69 yo male diagnosed with DVT. 77 of lower right extremity shows findings consistent with acute deep vein thrombosis involving the right external iliac vein, common femoral vein, right femoral vein, and right proximal profunda vein. CT of chest also shows new bilateral pulmonary emboli as described without right heart strain.  The patient is s/p mechanical and TPA directed thrombolysis for the RLE earlier today. The patient continues on RLE catheter directed alteplase and heparin infusion. A heparin level this evening is  SUBtherapeutic after a rate decrease earlier today, fibrinogen is 271. No bleeding noted at this time.   Pharmacy consulted to switch to Apixaban. Patient has received 4 days of anticoagulation with heparin therapy.  Will finish high intensity anticoagulation with apixaban for 3 more days then transition to maintenance dose apixaban  Plan:  -Apixaban 10 mg po bid x 3 days, then Apixaban 5 mg po bid - D/C Heparin drip 1 hour after first apixaban dose -Monitor for bleeding  [place], PharmD, [facility] Clinical Pharmacist Please see AMION for all Pharmacists' Contact Phone Numbers 10/02/2019, 8:21 AM

## 2019-10-02 NOTE — Progress Notes (Signed)
Referring Physician(s): Dr Mikeal Hawthorne Powell  Supervising Physician: Irish LackYamagata, Glenn  [facility]  [facility]  Chief Complaint:  Rt LE DVT Thrombolysis initiated 3/26   Subjective:  3/26 IR Note: Partial response to pulse spray mechanical thrombolysis and venous PTA, with restoration of flow through the affected segments. Initiation of catheter directed thrombolytic infusion.  3/27: Procedure: Follow up during venous thrombolytic therapy; right femoral vein angioplasty  Today pt is in bed and comfortable--much l;ess swelling in leg and much less pain He state site of infusion sl sore   Allergies: Patient has no known allergies.  Medications: Prior to Admission medications   Medication Sig Start Date End Date Taking? Authorizing Provider  ibuprofen (ADVIL) 200 MG tablet Take 600 mg by mouth every 6 (six) hours as needed for moderate pain.   Yes [provider]  lisinopril (ZESTRIL) 10 MG tablet Take 10 mg by mouth daily. 09/25/19   [provider]  memantine (NAMENDA) 5 MG tablet Take 5 mg by mouth 2 (two) times daily.  09/26/19   [provider]  sertraline (ZOLOFT) 100 MG tablet Take 100 mg by mouth daily. 09/25/19   [provider]  traZODone (DESYREL) 50 MG tablet Take 50 mg by mouth at bedtime.  09/26/19   [provider]     Vital Signs: BP 118/77 (BP Location: Left Arm)   Pulse (!) 56   Temp 98.4 F (36.9 C) (Oral)   Resp 20   Ht 5\' 9"  (1.753 m)   Wt 220 lb 7.4 oz (100 kg)   SpO2 96%   BMI 32.56 kg/m   Physical Exam Vitals reviewed.  Musculoskeletal:        General: Swelling present. Normal range of motion.     Comments: Minimal swelling of RLE  Skin:    General: Skin is warm and dry.     Comments: Site is clean and dry Minimal soreness at site No bleeding Rt foot 2+ pulses No other tenderness of leg  Neurological:     Mental Status: He is alert and oriented to person, place, and time.  Psychiatric:          Behavior: Behavior normal.     Imaging: IR Veno/Ext/Uni Right  Result Date: 09/30/2019 CLINICAL DATA:  Right lower extremity swelling and pain. Recent diagnosis of acute pulmonary emboli. Venography and ultrasound demonstrate right iliofemoral occlusive DVT. EXAM: THROMBOECTOMY MECHANICAL VENOUS; RIGHT EXTREMITY VENOGRAPHY; INFERIOR VENA CAVOGRAM; IR INFUSION THROMBOL VENOUS INITIAL (MS); IR ULTRASOUND GUIDANCE VASC ACCESS RIGHT; IR PTA VENOUS EXCEPT DIALYSIS CIRCUIT ANESTHESIA/SEDATION: Intravenous Fentanyl 75mcg and Versed 1.5mg  were administered as conscious sedation during continuous monitoring of the patient's level of consciousness and physiological / cardiorespiratory status by the radiology RN, with a total moderate sedation time of 48 minutes. MEDICATIONS: Lidocaine 1% subcutaneous CONTRAST:  70mL OMNIPAQUE IOHEXOL 300 MG/ML  SOLN PROCEDURE: The procedure, risks (including but not limited to bleeding, infection, organ damage ), benefits, and alternatives were explained to the patient. Questions regarding the procedure were encouraged and answered. The patient understands and consents to the procedure. Patient placed prone on the procedure table. Right leg prepped and draped in usual sterile fashion. Maximal barrier sterile technique was utilized including caps, mask, sterile gowns, sterile gloves, sterile drape, hand hygiene and skin antiseptic. After time-out, moderate sedation was initiated. After the skin was infiltrated with lidocaine, a right posterior tibial vein was accessed under ultrasound guidance with a 21 gauge micropuncture set at the proximal calf  level. Venography was performed. The micropuncture dilator was exchanged for an 8 [place] vascular sheath, through which a 5 [place] Kumpe catheter was advanced for right lower extremity venography. The catheter was exchanged over a guidewire for the [facility] Scientific AngioJet ZelanteDVT 105cm x 8Fdevice, used for pulse spray mechanical  thrombolysis of iliofemoral DVT using 5 mg tPA in 100 mL sterile saline. The device was subsequently used for mechanical thrombectomy of the iliofemoral DVT . Intermittent contrast injections were utilized to guide mechanical thrombo lysis. The device was then exchanged over the guidewire for a 12 mm x 4 cm atlas angioplasty balloon, used to macerate the thrombus through the right common femoral, external and common iliac veins. No high-grade stenosis was encountered. Antegrade flow was achieved, with moderate residual mural thrombus. 0 90/50 cm infusion catheter was then placed across regions of residual mural thrombus and catheter directed thrombo lytic infusion was initiated. The catheter and sheath were secured with an 0 silk suture and Steri-Strips the patient transferred to ICU for overnight infusion. The patient tolerated the procedure well. COMPLICATIONS: None immediate FINDINGS: Initial right lower extremity venography demonstrated occlusive DVT extending through the length of the right superficial femoral, common femoral, external iliac, and common iliac veins. IVC widely patent without thrombus. There was incomplete clearance of thrombus from the femoral and iliac veins with mechanical thrombolysis. The central thrombus responded easily to 12 mm balloon angioplasty. Antegrade flow through the length of the affected segment was restored, with scattered residual mural thrombus. Catheter directed pharmacologic thrombolysis was initiated. IMPRESSION: 1. Extensive occlusive acute thrombus through the right iliofemoral venous system. 2. Partial response to pulse spray mechanical thrombolysis and venous PTA, with restoration of flow through the affected segments. 3. Initiation of catheter directed thrombolytic infusion. PLAN: Overnight infusion with ICU observation. Follow-up venography in a.m. Electronically Signed   By: Corlis Leak M.D.   On: 09/30/2019 15:48   IR Venocavagram Ivc  Result Date:  09/30/2019 CLINICAL DATA:  Right lower extremity swelling and pain. Recent diagnosis of acute pulmonary emboli. Venography and ultrasound demonstrate right iliofemoral occlusive DVT. EXAM: THROMBOECTOMY MECHANICAL VENOUS; RIGHT EXTREMITY VENOGRAPHY; INFERIOR VENA CAVOGRAM; IR INFUSION THROMBOL VENOUS INITIAL (MS); IR ULTRASOUND GUIDANCE VASC ACCESS RIGHT; IR PTA VENOUS EXCEPT DIALYSIS CIRCUIT ANESTHESIA/SEDATION: Intravenous Fentanyl and Versed 1.5mg  were administered as conscious sedation during continuous monitoring of the patient's level of consciousness and physiological / cardiorespiratory status by the radiology RN, with a total moderate sedation time of 48 minutes. MEDICATIONS: Lidocaine 1% subcutaneous CONTRAST:  70mL OMNIPAQUE IOHEXOL 300 MG/ML  SOLN PROCEDURE: The procedure, risks (including but not limited to bleeding, infection, organ damage ), benefits, and alternatives were explained to the patient. Questions regarding the procedure were encouraged and answered. The patient understands and consents to the procedure. Patient placed prone on the procedure table. Right leg prepped and draped in usual sterile fashion. Maximal barrier sterile technique was utilized including caps, mask, sterile gowns, sterile gloves, sterile drape, hand hygiene and skin antiseptic. After time-out, moderate sedation was initiated. After the skin was infiltrated with lidocaine, a right posterior tibial vein was accessed under ultrasound guidance with a 21 gauge micropuncture set at the proximal calf level. Venography was performed. The micropuncture dilator was exchanged for an 8 [place] vascular sheath, through which a 5 [place] Kumpe catheter was advanced for right lower extremity venography. The catheter was exchanged over a guidewire for the [facility] Scientific AngioJet ZelanteDVT 105cm x 8Fdevice, used for pulse spray mechanical thrombolysis of iliofemoral DVT  using 5 mg tPA in 100 mL sterile saline. The device was  subsequently used for mechanical thrombectomy of the iliofemoral DVT . Intermittent contrast injections were utilized to guide mechanical thrombo lysis. The device was then exchanged over the guidewire for a 12 mm x 4 cm atlas angioplasty balloon, used to macerate the thrombus through the right common femoral, external and common iliac veins. No high-grade stenosis was encountered. Antegrade flow was achieved, with moderate residual mural thrombus. 0 90/50 cm infusion catheter was then placed across regions of residual mural thrombus and catheter directed thrombo lytic infusion was initiated. The catheter and sheath were secured with an 0 silk suture and Steri-Strips the patient transferred to ICU for overnight infusion. The patient tolerated the procedure well. COMPLICATIONS: None immediate FINDINGS: Initial right lower extremity venography demonstrated occlusive DVT extending through the length of the right superficial femoral, common femoral, external iliac, and common iliac veins. IVC widely patent without thrombus. There was incomplete clearance of thrombus from the femoral and iliac veins with mechanical thrombolysis. The central thrombus responded easily to 12 mm balloon angioplasty. Antegrade flow through the length of the affected segment was restored, with scattered residual mural thrombus. Catheter directed pharmacologic thrombolysis was initiated. IMPRESSION: 1. Extensive occlusive acute thrombus through the right iliofemoral venous system. 2. Partial response to pulse spray mechanical thrombolysis and venous PTA, with restoration of flow through the affected segments. 3. Initiation of catheter directed thrombolytic infusion. PLAN: Overnight infusion with ICU observation. Follow-up venography in a.m. Electronically Signed   By: Corlis Leak M.D.   On: 09/30/2019 15:48   IR THROMBECT VENO MECH MOD SED  Result Date: 09/30/2019 CLINICAL DATA:  Right lower extremity swelling and pain. Recent diagnosis of  acute pulmonary emboli. Venography and ultrasound demonstrate right iliofemoral occlusive DVT. EXAM: THROMBOECTOMY MECHANICAL VENOUS; RIGHT EXTREMITY VENOGRAPHY; INFERIOR VENA CAVOGRAM; IR INFUSION THROMBOL VENOUS INITIAL (MS); IR ULTRASOUND GUIDANCE VASC ACCESS RIGHT; IR PTA VENOUS EXCEPT DIALYSIS CIRCUIT ANESTHESIA/SEDATION: Intravenous Fentanyl and Versed 1.5mg  were administered as conscious sedation during continuous monitoring of the patient's level of consciousness and physiological / cardiorespiratory status by the radiology RN, with a total moderate sedation time of 48 minutes. MEDICATIONS: Lidocaine 1% subcutaneous CONTRAST:  62mL OMNIPAQUE IOHEXOL 300 MG/ML  SOLN PROCEDURE: The procedure, risks (including but not limited to bleeding, infection, organ damage ), benefits, and alternatives were explained to the patient. Questions regarding the procedure were encouraged and answered. The patient understands and consents to the procedure. Patient placed prone on the procedure table. Right leg prepped and draped in usual sterile fashion. Maximal barrier sterile technique was utilized including caps, mask, sterile gowns, sterile gloves, sterile drape, hand hygiene and skin antiseptic. After time-out, moderate sedation was initiated. After the skin was infiltrated with lidocaine, a right posterior tibial vein was accessed under ultrasound guidance with a 21 gauge micropuncture set at the proximal calf level. Venography was performed. The micropuncture dilator was exchanged for an 8 [place] vascular sheath, through which a 5 [place] Kumpe catheter was advanced for right lower extremity venography. The catheter was exchanged over a guidewire for the [facility] Scientific AngioJet ZelanteDVT 105cm x 8Fdevice, used for pulse spray mechanical thrombolysis of iliofemoral DVT using 5 mg tPA in 100 mL sterile saline. The device was subsequently used for mechanical thrombectomy of the iliofemoral DVT . Intermittent  contrast injections were utilized to guide mechanical thrombo lysis. The device was then exchanged over the guidewire for a 12 mm x 4 cm atlas angioplasty balloon,  used to macerate the thrombus through the right common femoral, external and common iliac veins. No high-grade stenosis was encountered. Antegrade flow was achieved, with moderate residual mural thrombus. 0 90/50 cm infusion catheter was then placed across regions of residual mural thrombus and catheter directed thrombo lytic infusion was initiated. The catheter and sheath were secured with an 0 silk suture and Steri-Strips the patient transferred to ICU for overnight infusion. The patient tolerated the procedure well. COMPLICATIONS: None immediate FINDINGS: Initial right lower extremity venography demonstrated occlusive DVT extending through the length of the right superficial femoral, common femoral, external iliac, and common iliac veins. IVC widely patent without thrombus. There was incomplete clearance of thrombus from the femoral and iliac veins with mechanical thrombolysis. The central thrombus responded easily to 12 mm balloon angioplasty. Antegrade flow through the length of the affected segment was restored, with scattered residual mural thrombus. Catheter directed pharmacologic thrombolysis was initiated. IMPRESSION: 1. Extensive occlusive acute thrombus through the right iliofemoral venous system. 2. Partial response to pulse spray mechanical thrombolysis and venous PTA, with restoration of flow through the affected segments. 3. Initiation of catheter directed thrombolytic infusion. PLAN: Overnight infusion with ICU observation. Follow-up venography in a.m. Electronically Signed   By: Lucrezia Europe M.D.   On: 09/30/2019 15:48   IR US Guide Vasc Access Right  Result Date: 09/30/2019 CLINICAL DATA:  Right lower extremity swelling and pain. Recent diagnosis of acute pulmonary emboli. Venography and ultrasound demonstrate right iliofemoral  occlusive DVT. EXAM: THROMBOECTOMY MECHANICAL VENOUS; RIGHT EXTREMITY VENOGRAPHY; INFERIOR VENA CAVOGRAM; IR INFUSION THROMBOL VENOUS INITIAL (MS); IR ULTRASOUND GUIDANCE VASC ACCESS RIGHT; IR PTA VENOUS EXCEPT DIALYSIS CIRCUIT ANESTHESIA/SEDATION: Intravenous Fentanyl 25mcg and Versed 1.5mg  were administered as conscious sedation during continuous monitoring of the patient's level of consciousness and physiological / cardiorespiratory status by the radiology RN, with a total moderate sedation time of 48 minutes. MEDICATIONS: Lidocaine 1% subcutaneous CONTRAST:  61mL OMNIPAQUE IOHEXOL 300 MG/ML  SOLN PROCEDURE: The procedure, risks (including but not limited to bleeding, infection, organ damage ), benefits, and alternatives were explained to the patient. Questions regarding the procedure were encouraged and answered. The patient understands and consents to the procedure. Patient placed prone on the procedure table. Right leg prepped and draped in usual sterile fashion. Maximal barrier sterile technique was utilized including caps, mask, sterile gowns, sterile gloves, sterile drape, hand hygiene and skin antiseptic. After time-out, moderate sedation was initiated. After the skin was infiltrated with lidocaine, a right posterior tibial vein was accessed under ultrasound guidance with a 21 gauge micropuncture set at the proximal calf level. Venography was performed. The micropuncture dilator was exchanged for an 8 [place] vascular sheath, through which a 5 [place] Kumpe catheter was advanced for right lower extremity venography. The catheter was exchanged over a guidewire for the [place] ZelanteDVT 105cm x 8Fdevice, used for pulse spray mechanical thrombolysis of iliofemoral DVT using 5 mg tPA in 100 mL sterile saline. The device was subsequently used for mechanical thrombectomy of the iliofemoral DVT . Intermittent contrast injections were utilized to guide mechanical thrombo lysis. The device was  then exchanged over the guidewire for a 12 mm x 4 cm atlas angioplasty balloon, used to macerate the thrombus through the right common femoral, external and common iliac veins. No high-grade stenosis was encountered. Antegrade flow was achieved, with moderate residual mural thrombus. 0 90/50 cm infusion catheter was then placed across regions of residual mural thrombus and catheter directed thrombo lytic infusion was initiated.  The catheter and sheath were secured with an 0 silk suture and Steri-Strips the patient transferred to ICU for overnight infusion. The patient tolerated the procedure well. COMPLICATIONS: None immediate FINDINGS: Initial right lower extremity venography demonstrated occlusive DVT extending through the length of the right superficial femoral, common femoral, external iliac, and common iliac veins. IVC widely patent without thrombus. There was incomplete clearance of thrombus from the femoral and iliac veins with mechanical thrombolysis. The central thrombus responded easily to 12 mm balloon angioplasty. Antegrade flow through the length of the affected segment was restored, with scattered residual mural thrombus. Catheter directed pharmacologic thrombolysis was initiated. IMPRESSION: 1. Extensive occlusive acute thrombus through the right iliofemoral venous system. 2. Partial response to pulse spray mechanical thrombolysis and venous PTA, with restoration of flow through the affected segments. 3. Initiation of catheter directed thrombolytic infusion. PLAN: Overnight infusion with ICU observation. Follow-up venography in a.m. Electronically Signed   By: Corlis Leak M.D.   On: 09/30/2019 15:48   IR PTA VENOUS EXCEPT DIALYSIS CIRCUIT  Result Date: 10/01/2019 INDICATION: 24 hours status post initiation of transcatheter thrombolytic therapy after mechanical thrombectomy and angioplasty to treat extensive right iliofemoral DVT. EXAM: 1. FOLLOW-UP DURING VENOUS THROMBOLYTIC THERAPY OF THE RIGHT  LOWER EXTREMITY, FINAL DAY 2. VENOUS ANGIOPLASTY OF THE RIGHT FEMORAL VEIN COMPARISON:  09/30/2019 MEDICATIONS: None. ANESTHESIA/SEDATION: Versed 1.0 mg IV; Fentanyl 50 mcg IV Moderate Sedation Time:  40 minutes. The patient was continuously monitored during the procedure by the interventional radiology nurse under my direct supervision. FLUOROSCOPY TIME:  Fluoroscopy Time: 5 minutes and 54 seconds. 162.0 mGy. COMPLICATIONS: None immediate. TECHNIQUE: Informed written consent was obtained from the patient after a thorough discussion of the procedural risks, benefits and alternatives. All questions were addressed. Maximal Sterile Barrier Technique was utilized including caps, mask, sterile gowns, sterile gloves, sterile drape, hand hygiene and skin antiseptic. A timeout was performed prior to the initiation of the procedure. Initial venography was performed through the pre-existing infusion catheter with visualization of the entire deep venous system of the right lower extremity beginning at the level of the popliteal vein and extending through the level of the common iliac vein. Balloon angioplasty of the upper thigh segment of the right femoral vein was then performed with a 9 mm x 4 cm [place] balloon after removal of the infusion catheter over a wire. Additional venography was performed. Additional balloon angioplasty of the mid and distal thigh segments of the right femoral vein were then performed with an 8 mm x 4 cm Conquest balloon. Additional venography was performed through the popliteal venous sheath. The sheath was then removed and hemostasis obtained with manual compression. FINDINGS: Follow-up venography approximately 24 hours after initiation of venous thrombolytic therapy demonstrates significant improvement in patency of the right femoral, common femoral, external iliac and common iliac veins. There remains multiple areas of irregular narrowing of the femoral vein likely related to adherent chronic  thrombus. A more focal stenosis of the femoral vein is also present just inferior to the lesser trochanter of the femur. After initial proximal femoral vein angioplasty, there remained somewhat poor antegrade flow through the femoral venous segment. Additional angioplasty was therefore performed of the mid and distal femoral vein segments. This resulted in improved antegrade flow throughout the femoral vein. On completion, there were no significant areas venous stenosis present throughout the deep venous system of the right lower extremity. IMPRESSION: Completion of thrombolytic therapy to treat extensive right iliofemoral DVT. After overnight lytic therapy, there  is improved venous patency. Areas of residual narrowing of the right femoral vein were successfully treated with 8 mm and 9 mm balloon angioplasty with improved venous patency and improved antegrade flow present on completion. Electronically Signed   By: Irish Lack M.D.   On: 10/01/2019 12:24   IR PTA VENOUS EXCEPT DIALYSIS CIRCUIT  Result Date: 09/30/2019 CLINICAL DATA:  Right lower extremity swelling and pain. Recent diagnosis of acute pulmonary emboli. Venography and ultrasound demonstrate right iliofemoral occlusive DVT. EXAM: THROMBOECTOMY MECHANICAL VENOUS; RIGHT EXTREMITY VENOGRAPHY; INFERIOR VENA CAVOGRAM; IR INFUSION THROMBOL VENOUS INITIAL (MS); IR ULTRASOUND GUIDANCE VASC ACCESS RIGHT; IR PTA VENOUS EXCEPT DIALYSIS CIRCUIT ANESTHESIA/SEDATION: Intravenous Fentanyl and Versed 1.5mg  were administered as conscious sedation during continuous monitoring of the patient's level of consciousness and physiological / cardiorespiratory status by the radiology RN, with a total moderate sedation time of 48 minutes. MEDICATIONS: Lidocaine 1% subcutaneous CONTRAST:  70mL OMNIPAQUE IOHEXOL 300 MG/ML  SOLN PROCEDURE: The procedure, risks (including but not limited to bleeding, infection, organ damage ), benefits, and alternatives were explained to  the patient. Questions regarding the procedure were encouraged and answered. The patient understands and consents to the procedure. Patient placed prone on the procedure table. Right leg prepped and draped in usual sterile fashion. Maximal barrier sterile technique was utilized including caps, mask, sterile gowns, sterile gloves, sterile drape, hand hygiene and skin antiseptic. After time-out, moderate sedation was initiated. After the skin was infiltrated with lidocaine, a right posterior tibial vein was accessed under ultrasound guidance with a 21 gauge micropuncture set at the proximal calf level. Venography was performed. The micropuncture dilator was exchanged for an 8 [place] vascular sheath, through which a 5 [place] Kumpe catheter was advanced for right lower extremity venography. The catheter was exchanged over a guidewire for the [facility] Scientific AngioJet ZelanteDVT 105cm x 8Fdevice, used for pulse spray mechanical thrombolysis of iliofemoral DVT using 5 mg tPA in 100 mL sterile saline. The device was subsequently used for mechanical thrombectomy of the iliofemoral DVT . Intermittent contrast injections were utilized to guide mechanical thrombo lysis. The device was then exchanged over the guidewire for a 12 mm x 4 cm atlas angioplasty balloon, used to macerate the thrombus through the right common femoral, external and common iliac veins. No high-grade stenosis was encountered. Antegrade flow was achieved, with moderate residual mural thrombus. 0 90/50 cm infusion catheter was then placed across regions of residual mural thrombus and catheter directed thrombo lytic infusion was initiated. The catheter and sheath were secured with an 0 silk suture and Steri-Strips the patient transferred to ICU for overnight infusion. The patient tolerated the procedure well. COMPLICATIONS: None immediate FINDINGS: Initial right lower extremity venography demonstrated occlusive DVT extending through the length of the right  superficial femoral, common femoral, external iliac, and common iliac veins. IVC widely patent without thrombus. There was incomplete clearance of thrombus from the femoral and iliac veins with mechanical thrombolysis. The central thrombus responded easily to 12 mm balloon angioplasty. Antegrade flow through the length of the affected segment was restored, with scattered residual mural thrombus. Catheter directed pharmacologic thrombolysis was initiated. IMPRESSION: 1. Extensive occlusive acute thrombus through the right iliofemoral venous system. 2. Partial response to pulse spray mechanical thrombolysis and venous PTA, with restoration of flow through the affected segments. 3. Initiation of catheter directed thrombolytic infusion. PLAN: Overnight infusion with ICU observation. Follow-up venography in a.m. Electronically Signed   By: Corlis Leak M.D.   On: 09/30/2019 15:48  IR INFUSION THROMBOL VENOUS INITIAL (MS)  Result Date: 09/30/2019 CLINICAL DATA:  Right lower extremity swelling and pain. Recent diagnosis of acute pulmonary emboli. Venography and ultrasound demonstrate right iliofemoral occlusive DVT. EXAM: THROMBOECTOMY MECHANICAL VENOUS; RIGHT EXTREMITY VENOGRAPHY; INFERIOR VENA CAVOGRAM; IR INFUSION THROMBOL VENOUS INITIAL (MS); IR ULTRASOUND GUIDANCE VASC ACCESS RIGHT; IR PTA VENOUS EXCEPT DIALYSIS CIRCUIT ANESTHESIA/SEDATION: Intravenous Fentanyl and Versed 1.5mg  were administered as conscious sedation during continuous monitoring of the patient's level of consciousness and physiological / cardiorespiratory status by the radiology RN, with a total moderate sedation time of 48 minutes. MEDICATIONS: Lidocaine 1% subcutaneous CONTRAST:  4mL OMNIPAQUE IOHEXOL 300 MG/ML  SOLN PROCEDURE: The procedure, risks (including but not limited to bleeding, infection, organ damage ), benefits, and alternatives were explained to the patient. Questions regarding the procedure were encouraged and answered. The  patient understands and consents to the procedure. Patient placed prone on the procedure table. Right leg prepped and draped in usual sterile fashion. Maximal barrier sterile technique was utilized including caps, mask, sterile gowns, sterile gloves, sterile drape, hand hygiene and skin antiseptic. After time-out, moderate sedation was initiated. After the skin was infiltrated with lidocaine, a right posterior tibial vein was accessed under ultrasound guidance with a 21 gauge micropuncture set at the proximal calf level. Venography was performed. The micropuncture dilator was exchanged for an 8 [place] vascular sheath, through which a 5 [place] Kumpe catheter was advanced for right lower extremity venography. The catheter was exchanged over a guidewire for the [facility] [employer] AngioJet ZelanteDVT 105cm x 8Fdevice, used for pulse spray mechanical thrombolysis of iliofemoral DVT using 5 mg tPA in 100 mL sterile saline. The device was subsequently used for mechanical thrombectomy of the iliofemoral DVT . Intermittent contrast injections were utilized to guide mechanical thrombo lysis. The device was then exchanged over the guidewire for a 12 mm x 4 cm atlas angioplasty balloon, used to macerate the thrombus through the right common femoral, external and common iliac veins. No high-grade stenosis was encountered. Antegrade flow was achieved, with moderate residual mural thrombus. 0 90/50 cm infusion catheter was then placed across regions of residual mural thrombus and catheter directed thrombo lytic infusion was initiated. The catheter and sheath were secured with an 0 silk suture and Steri-Strips the patient transferred to ICU for overnight infusion. The patient tolerated the procedure well. COMPLICATIONS: None immediate FINDINGS: Initial right lower extremity venography demonstrated occlusive DVT extending through the length of the right superficial femoral, common femoral, external iliac, and common iliac veins. IVC  widely patent without thrombus. There was incomplete clearance of thrombus from the femoral and iliac veins with mechanical thrombolysis. The central thrombus responded easily to 12 mm balloon angioplasty. Antegrade flow through the length of the affected segment was restored, with scattered residual mural thrombus. Catheter directed pharmacologic thrombolysis was initiated. IMPRESSION: 1. Extensive occlusive acute thrombus through the right iliofemoral venous system. 2. Partial response to pulse spray mechanical thrombolysis and venous PTA, with restoration of flow through the affected segments. 3. Initiation of catheter directed thrombolytic infusion. PLAN: Overnight infusion with ICU observation. Follow-up venography in a.m. Electronically Signed   By: Corlis Leak M.D.   On: 09/30/2019 15:48   IR THROMB F/U EVAL ART/VEN FINAL DAY (MS)  Result Date: 10/01/2019 INDICATION: 24 hours status post initiation of transcatheter thrombolytic therapy after mechanical thrombectomy and angioplasty to treat extensive right iliofemoral DVT. EXAM: 1. FOLLOW-UP DURING VENOUS THROMBOLYTIC THERAPY OF THE RIGHT LOWER EXTREMITY, FINAL DAY 2. VENOUS ANGIOPLASTY OF  THE RIGHT FEMORAL VEIN COMPARISON:  09/30/2019 MEDICATIONS: None. ANESTHESIA/SEDATION: Versed 1.0 mg IV; Fentanyl 50 mcg IV Moderate Sedation Time:  40 minutes. The patient was continuously monitored during the procedure by the interventional radiology nurse under my direct supervision. FLUOROSCOPY TIME:  Fluoroscopy Time: 5 minutes and 54 seconds. 162.0 mGy. COMPLICATIONS: None immediate. TECHNIQUE: Informed written consent was obtained from the patient after a thorough discussion of the procedural risks, benefits and alternatives. All questions were addressed. Maximal Sterile Barrier Technique was utilized including caps, mask, sterile gowns, sterile gloves, sterile drape, hand hygiene and skin antiseptic. A timeout was performed prior to the initiation of the  procedure. Initial venography was performed through the pre-existing infusion catheter with visualization of the entire deep venous system of the right lower extremity beginning at the level of the popliteal vein and extending through the level of the common iliac vein. Balloon angioplasty of the upper thigh segment of the right femoral vein was then performed with a 9 mm x 4 cm Conquest balloon after removal of the infusion catheter over a wire. Additional venography was performed. Additional balloon angioplasty of the mid and distal thigh segments of the right femoral vein were then performed with an 8 mm x 4 cm Conquest balloon. Additional venography was performed through the popliteal venous sheath. The sheath was then removed and hemostasis obtained with manual compression. FINDINGS: Follow-up venography approximately 24 hours after initiation of venous thrombolytic therapy demonstrates significant improvement in patency of the right femoral, common femoral, external iliac and common iliac veins. There remains multiple areas of irregular narrowing of the femoral vein likely related to adherent chronic thrombus. A more focal stenosis of the femoral vein is also present just inferior to the lesser trochanter of the femur. After initial proximal femoral vein angioplasty, there remained somewhat poor antegrade flow through the femoral venous segment. Additional angioplasty was therefore performed of the mid and distal femoral vein segments. This resulted in improved antegrade flow throughout the femoral vein. On completion, there were no significant areas venous stenosis present throughout the deep venous system of the right lower extremity. IMPRESSION: Completion of thrombolytic therapy to treat extensive right iliofemoral DVT. After overnight lytic therapy, there is improved venous patency. Areas of residual narrowing of the right femoral vein were successfully treated with 8 mm and 9 mm balloon angioplasty with  improved venous patency and improved antegrade flow present on completion. Electronically Signed   By: Irish Lack M.D.   On: 10/01/2019 12:24    Labs:  CBC: Recent Labs    09/30/19 1732 09/30/19 2051 10/01/19 0606 10/02/19 0530  WBC 5.9 6.1 6.3 5.0  HGB 13.1 12.5* 12.5* 12.3*  HCT 37.5* 36.1* 36.3* 35.9*  PLT 127* 125* 125* 134*    COAGS: No results for input(s): INR, APTT in the last 8760 hours.  BMP: Recent Labs    09/30/19 0127 09/30/19 0813 10/01/19 0606 10/02/19 0530  NA 136 138 137 138  K 3.7 3.8 3.9 3.9  CL 105 107 105 108  CO2 23 23 21* 23  GLUCOSE 121* 116* 105* 112*  BUN 24* 25* 14 12  CALCIUM 8.2* 8.3* 8.4* 8.5*  CREATININE 1.34* 1.33* 1.17 0.99  GFRNONAA 54* 54* >60 >60  GFRAA >60 >60 >60 >60    LIVER FUNCTION TESTS: Recent Labs    09/27/19 1126 09/30/19 0127 10/01/19 0606 10/02/19 0530  BILITOT 1.8* 1.1 1.4* 1.1  AST 36 29 50* 33  ALT 39 29 31 31   ALKPHOS  68 58 53 56  PROT 8.2* 6.0* 5.7* 5.7*  ALBUMIN 4.5 3.1* 2.9* 2.9*    Assessment and Plan:  RLE DVT 3/26 and 3/27 venous thrombolysis in IR Doing well For DC today?---per admitting MD Pt will be notified from IR OP [facility] for 4 week follow up with Dr Deanne Coffer To include doppler and evaluation Orders in place He is aware  Electronically Signed: Robet Leu, PA-C 10/02/2019, 8:58 AM   I spent a total of 15 Minutes at the the patient's bedside AND on the patient's hospital floor or unit, greater than 50% of which was counseling/coordinating care for RLE DVT lysis

## 2019-10-02 NOTE — Discharge Summary (Addendum)
Physician Discharge Summary  Cristian Huang NFA:[id number] DOB: 07/13/1950 DOA: 09/27/2019  PCP: Patient, No Pcp Per  Admit date: 09/27/2019 Discharge date: 10/02/2019  Time spent: 40 minutes  Recommendations for Outpatient Follow-up:  1. Follow outpatient CBC/CMP 2. Follow with IR outpatient 3. Continue eliquis - needs 3 months uninterrupted - would recommend hematology follow up outpatient to discuss long term anticoagulation (appears to have been unprovoked)  4. Ensure up to date with routine cancer screening  Discharge Diagnoses:  Principal Problem:   Right leg DVT (HCC) Active Problems:   Pulmonary emboli (HCC)   AKI (acute kidney injury) (HCC)   Thrombocytopenia (HCC)  Discharge Condition: stable  Diet recommendation: heart healthy  Filed Weights   09/27/19 1029 09/29/19 1701  Weight: 99.3 kg 100 kg    History of present illness:  69 year old with no significant past medical history who presents complaining of right lower extremity edema. He also reports some dyspnea with exertion and pain of his right leg on ambulation.  Patient was found to have DVT and PE. Due to extension of the DVT IR was consulted for further evaluation and treatment for DVT.  He was admitted for PE and extensive RLE DVT.  He was seen by IR who performed thrombolytic therapy and angioplasty.  Discharged on eliquis.  Plan for outpatient follow up in 4 weeks with IR.  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/18 Proofreader) - says this is the only different thing recently, unclear if this is related, vaers submitted (discussed with pt who gave consent to submit) - otherwise, nothing he can think of - needs to make sure cancer screening is up to date Bilateral PE without RH strain on CT scan RLE [place] with acute DVT involving R external iliac vein, common  femoral, R femoral, and R proximal profunda vein Continue heparin gtt S/p RLE venogram, mechanical thrombolysis, venous PTA, initiation of catheter directed TPA thrombolysis on 3/26 by IR 3/27 had completion of thrombolytic therapy, angioplasty Discharge with eliquis 10 mg BID x 3 days (discussed with pharm) followed by 5 mg BID - needs outpatient follow up with PCP/hematology regarding long term use - needs at least 3 months uninterrupted therapy  2-Thrombocytopenia: follow outpatient  3-AKI/hyponatremia: normalized  # Hypertension: lisinopril on hold  4-Hyperbilirubinemia;improved  Procedures: 3/26 IMPRESSION: 1. Extensive occlusive acute thrombus through the right iliofemoral venous system. 2. Partial response to pulse spray mechanical thrombolysis and venous PTA, with restoration of flow through the affected segments. 3. Initiation of catheter directed thrombolytic infusion.  3/27 IMPRESSION: Completion of thrombolytic therapy to treat extensive right iliofemoral DVT. After overnight lytic therapy, there is improved venous patency. Areas of residual narrowing of the right femoral vein were successfully treated with 8 mm and 9 mm balloon angioplasty with improved venous patency and improved antegrade flow present on completion.  Echo IMPRESSIONS    1. Left ventricular ejection fraction, by estimation, is 60 to 65%. The  left ventricle has normal function. The left ventricle has no regional  wall motion abnormalities. Left ventricular diastolic parameters are  consistent with Grade I diastolic  dysfunction (impaired relaxation).  2. Right ventricular systolic function is normal. The right ventricular  size is normal.  3. The mitral valve is grossly normal. No evidence of mitral valve  regurgitation. No evidence of mitral stenosis.  4. The aortic valve  is tricuspid. Aortic valve regurgitation is not  visualized. No aortic stenosis is present.   3/23 LE  US Summary:  RIGHT:  - Findings consistent with acute deep vein thrombosis involving the right  external iliac vein, common femoral vein, right femoral vein, and right  proximal profunda vein.  - No cystic structure found in the popliteal fossa.    LEFT:  - No evidence of common femoral vein obstruction.   Consultations:  IR  Discharge Exam: Vitals:   10/02/19 0502 10/02/19 0752  BP: 113/72 118/77  Pulse:  (!) 56  Resp:  20  Temp: 98.2 F (36.8 C) 98.4 F (36.9 C)  SpO2:  96%   Ready to go home  General: No acute distress. Cardiovascular: Heart sounds show Elai Vanwyk regular rate, and rhythm. Lungs: Clear to auscultation bilaterally Abdomen: Soft, nontender, nondistended Neurological: Alert and oriented 3. Moves all extremities 4. Cranial nerves II through XII grossly intact. Skin: Warm and dry. No rashes or lesions. Extremities: RLE in compression stocking  Discharge Instructions   Discharge Instructions    Call MD for:  difficulty breathing, headache or visual disturbances   Complete by: As directed    Call MD for:  extreme fatigue   Complete by: As directed    Call MD for:  persistant dizziness or light-headedness   Complete by: As directed    Call MD for:  persistant nausea and vomiting   Complete by: As directed    Call MD for:  redness, tenderness, or signs of infection (pain, swelling, redness, odor or green/yellow discharge around incision site)   Complete by: As directed    Call MD for:  severe uncontrolled pain   Complete by: As directed    Call MD for:  temperature >100.4   Complete by: As directed    Diet - low sodium heart healthy   Complete by: As directed    Discharge instructions   Complete by: As directed    You were seen for blood clots.  You had Daivd Fredericksen procedure with interventional radiology and should follow up with them in clinic in about 4 weeks.  Continue eliquis.  For the next 3 days, you'll take 10 mg twice daily.  After completing this dose,  take 5 mg twice daily.  You should continue this uninterrupted for 3 months.  After this, please discuss with your PCP whether this needs to be continued.  You should probably see hematology to weigh in on this.  You need updated cancer screening.  Return for new, recurrent, or worsening symptoms.  Please ask your PCP to request records from this hospitalization so they know what was done and what the next steps will be.   Increase activity slowly   Complete by: As directed      Allergies as of 10/02/2019   No Known Allergies     Medication List    STOP taking these medications   ibuprofen 200 MG tablet Commonly known as: ADVIL     TAKE these medications   apixaban 5 MG Tabs tablet Commonly known as: ELIQUIS Take 2 tablets (10 mg total) by mouth 2 (two) times daily for 3 days. And then transition to 5 mg twice daily   apixaban 5 MG Tabs tablet Commonly known as: ELIQUIS Take 1 tablet (5 mg total) by mouth 2 (two) times daily. Start taking on: October 05, 2019   lisinopril 10 MG tablet Commonly known as: ZESTRIL Take 10 mg by mouth daily.   memantine 5  MG tablet Commonly known as: NAMENDA Take 5 mg by mouth 2 (two) times daily.   sertraline 100 MG tablet Commonly known as: ZOLOFT Take 100 mg by mouth daily.   traZODone 50 MG tablet Commonly known as: DESYREL Take 50 mg by mouth at bedtime.      No Known Allergies Follow-up Information    Oley Balm, MD Follow up in 4 week(s).   Specialties: Interventional Radiology, Radiology Why: IR OP Clinic will call pt with time and date of 4 week follow up ---call [phone] if questions Contact information: [address] [place] [address] [phone]        [facility] Call.   Why: Please call to find Azariah Latendresse primary doctor in the [facility].  You may also call the number on your insurance card.  Contact information: [phone]           The results of significant diagnostics  from this hospitalization (including imaging, microbiology, ancillary and laboratory) are listed below for reference.    Significant Diagnostic Studies: CT Angio Chest PE W and/or Wo Contrast  Result Date: 09/27/2019 CLINICAL DATA:  New history of DVT with exertional shortness of breath EXAM: CT ANGIOGRAPHY CHEST WITH CONTRAST TECHNIQUE: Multidetector CT imaging of the chest was performed using the standard protocol during bolus administration of intravenous contrast. Multiplanar CT image reconstructions and MIPs were obtained to evaluate the vascular anatomy. CONTRAST:  OMNIPAQUE IOHEXOL 350 MG/ML SOLN COMPARISON:  None. FINDINGS: Cardiovascular: Thoracic aorta demonstrates Shamila Lerch normal branching pattern. Scattered atherosclerotic calcifications are noted. No aneurysmal dilatation or dissection is seen. No cardiac enlargement is seen. Mild coronary calcifications are seen. The pulmonary artery shows Haldon Carley normal branching pattern. New bilateral lower lobe pulmonary emboli are seen slightly greater on the right than the left. Right upper lobe pulmonary emboli are noted as well. No right heart strain is seen. Mediastinum/Nodes: Thoracic inlet is within normal limits. No sizable hilar or mediastinal adenopathy is noted. The esophagus as visualized is within normal limits. Lungs/Pleura: Lungs are well aerated bilaterally. Some dependent atelectatic changes are seen. No findings to suggest pulmonary infarct are noted at this time. No pleural effusion is seen. No sizable parenchymal nodules are noted. Upper Abdomen: Visualized upper abdomen shows no acute abnormality. Musculoskeletal: Mild degenerative change of the thoracic spine is seen. No acute bony abnormality is noted. Review of the MIP images confirms the above findings. IMPRESSION: New bilateral pulmonary emboli as described without right heart strain. Mild dependent atelectatic changes are seen. Aortic Atherosclerosis (ICD10-I70.0). These results were called  by telephone at the time of interpretation on 09/27/2019 at 1:22 pm to provider [facility] , who verbally acknowledged these results. Electronically Signed   By: Alcide Clever M.D.   On: 09/27/2019 13:24   IR Veno/Ext/Uni Right  Result Date: 09/30/2019 CLINICAL DATA:  Right lower extremity swelling and pain. Recent diagnosis of acute pulmonary emboli. Venography and ultrasound demonstrate right iliofemoral occlusive DVT. EXAM: THROMBOECTOMY MECHANICAL VENOUS; RIGHT EXTREMITY VENOGRAPHY; INFERIOR VENA CAVOGRAM; IR INFUSION THROMBOL VENOUS INITIAL (MS); IR ULTRASOUND GUIDANCE VASC ACCESS RIGHT; IR PTA VENOUS EXCEPT DIALYSIS CIRCUIT ANESTHESIA/SEDATION: Intravenous Fentanyl and Versed 1.5mg  were administered as conscious sedation during continuous monitoring of the patient's level of consciousness and physiological / cardiorespiratory status by the radiology RN, with Veneda Kirksey total moderate sedation time of 48 minutes. MEDICATIONS: Lidocaine 1% subcutaneous CONTRAST:  70mL OMNIPAQUE IOHEXOL 300 MG/ML  SOLN PROCEDURE: The procedure, risks (including but not limited to bleeding,  infection, organ damage ), benefits, and alternatives were explained to the patient. Questions regarding the procedure were encouraged and answered. The patient understands and consents to the procedure. Patient placed prone on the procedure table. Right leg prepped and draped in usual sterile fashion. Maximal barrier sterile technique was utilized including caps, mask, sterile gowns, sterile gloves, sterile drape, hand hygiene and skin antiseptic. After time-out, moderate sedation was initiated. After the skin was infiltrated with lidocaine, Raelynne Ludwick right posterior tibial vein was accessed under ultrasound guidance with Kierria Feigenbaum 21 gauge micropuncture set at the proximal calf level. Venography was performed. The micropuncture dilator was exchanged for an 8 [place] vascular sheath, through which Kaizer Dissinger 5 [place] Kumpe catheter was advanced for right lower  extremity venography. The catheter was exchanged over Sabrinna Yearwood guidewire for the [facility] Scientific AngioJet ZelanteDVT 105cm x 8Fdevice, used for pulse spray mechanical thrombolysis of iliofemoral DVT using 5 mg tPA in 100 mL sterile saline. The device was subsequently used for mechanical thrombectomy of the iliofemoral DVT . Intermittent contrast injections were utilized to guide mechanical thrombo lysis. The device was then exchanged over the guidewire for Elizet Kaplan 12 mm x 4 cm atlas angioplasty balloon, used to macerate the thrombus through the right common femoral, external and common iliac veins. No high-grade stenosis was encountered. Antegrade flow was achieved, with moderate residual mural thrombus. 0 90/50 cm infusion catheter was then placed across regions of residual mural thrombus and catheter directed thrombo lytic infusion was initiated. The catheter and sheath were secured with an 0 silk suture and Steri-Strips the patient transferred to ICU for overnight infusion. The patient tolerated the procedure well. COMPLICATIONS: None immediate FINDINGS: Initial right lower extremity venography demonstrated occlusive DVT extending through the length of the right superficial femoral, common femoral, external iliac, and common iliac veins. IVC widely patent without thrombus. There was incomplete clearance of thrombus from the femoral and iliac veins with mechanical thrombolysis. The central thrombus responded easily to 12 mm balloon angioplasty. Antegrade flow through the length of the affected segment was restored, with scattered residual mural thrombus. Catheter directed pharmacologic thrombolysis was initiated. IMPRESSION: 1. Extensive occlusive acute thrombus through the right iliofemoral venous system. 2. Partial response to pulse spray mechanical thrombolysis and venous PTA, with restoration of flow through the affected segments. 3. Initiation of catheter directed thrombolytic infusion. PLAN: Overnight infusion with ICU  observation. Follow-up venography in [place].m. Electronically Signed   By: Corlis Leak M.D.   On: 09/30/2019 15:48   IR Venocavagram Ivc  Result Date: 09/30/2019 CLINICAL DATA:  Right lower extremity swelling and pain. Recent diagnosis of acute pulmonary emboli. Venography and ultrasound demonstrate right iliofemoral occlusive DVT. EXAM: THROMBOECTOMY MECHANICAL VENOUS; RIGHT EXTREMITY VENOGRAPHY; INFERIOR VENA CAVOGRAM; IR INFUSION THROMBOL VENOUS INITIAL (MS); IR ULTRASOUND GUIDANCE VASC ACCESS RIGHT; IR PTA VENOUS EXCEPT DIALYSIS CIRCUIT ANESTHESIA/SEDATION: Intravenous Fentanyl and Versed 1.5mg  were administered as conscious sedation during continuous monitoring of the patient's level of consciousness and physiological / cardiorespiratory status by the radiology RN, with Kendarrius Tanzi total moderate sedation time of 48 minutes. MEDICATIONS: Lidocaine 1% subcutaneous CONTRAST:  70mL OMNIPAQUE IOHEXOL 300 MG/ML  SOLN PROCEDURE: The procedure, risks (including but not limited to bleeding, infection, organ damage ), benefits, and alternatives were explained to the patient. Questions regarding the procedure were encouraged and answered. The patient understands and consents to the procedure. Patient placed prone on the procedure table. Right leg prepped and draped in usual sterile fashion. Maximal barrier sterile technique was utilized including caps, mask, sterile  gowns, sterile gloves, sterile drape, hand hygiene and skin antiseptic. After time-out, moderate sedation was initiated. After the skin was infiltrated with lidocaine, Kenidi Elenbaas right posterior tibial vein was accessed under ultrasound guidance with Majed Pellegrin 21 gauge micropuncture set at the proximal calf level. Venography was performed. The micropuncture dilator was exchanged for an 8 [place] vascular sheath, through which Porschea Borys 5 [place] Kumpe catheter was advanced for right lower extremity venography. The catheter was exchanged over Jaira Canady guidewire for the [place]  ZelanteDVT 105cm x 8Fdevice, used for pulse spray mechanical thrombolysis of iliofemoral DVT using 5 mg tPA in 100 mL sterile saline. The device was subsequently used for mechanical thrombectomy of the iliofemoral DVT . Intermittent contrast injections were utilized to guide mechanical thrombo lysis. The device was then exchanged over the guidewire for Taggert Bozzi 12 mm x 4 cm atlas angioplasty balloon, used to macerate the thrombus through the right common femoral, external and common iliac veins. No high-grade stenosis was encountered. Antegrade flow was achieved, with moderate residual mural thrombus. 0 90/50 cm infusion catheter was then placed across regions of residual mural thrombus and catheter directed thrombo lytic infusion was initiated. The catheter and sheath were secured with an 0 silk suture and Steri-Strips the patient transferred to ICU for overnight infusion. The patient tolerated the procedure well. COMPLICATIONS: None immediate FINDINGS: Initial right lower extremity venography demonstrated occlusive DVT extending through the length of the right superficial femoral, common femoral, external iliac, and common iliac veins. IVC widely patent without thrombus. There was incomplete clearance of thrombus from the femoral and iliac veins with mechanical thrombolysis. The central thrombus responded easily to 12 mm balloon angioplasty. Antegrade flow through the length of the affected segment was restored, with scattered residual mural thrombus. Catheter directed pharmacologic thrombolysis was initiated. IMPRESSION: 1. Extensive occlusive acute thrombus through the right iliofemoral venous system. 2. Partial response to pulse spray mechanical thrombolysis and venous PTA, with restoration of flow through the affected segments. 3. Initiation of catheter directed thrombolytic infusion. PLAN: Overnight infusion with ICU observation. Follow-up venography in [place]isch[place].m. Electronically Signed   By: Lucrezia Europe M.D.   On:  09/30/2019 15:48   IR THROMBECT VENO MECH MOD SED  Result Date: 09/30/2019 CLINICAL DATA:  Right lower extremity swelling and pain. Recent diagnosis of acute pulmonary emboli. Venography and ultrasound demonstrate right iliofemoral occlusive DVT. EXAM: THROMBOECTOMY MECHANICAL VENOUS; RIGHT EXTREMITY VENOGRAPHY; INFERIOR VENA CAVOGRAM; IR INFUSION THROMBOL VENOUS INITIAL (MS); IR ULTRASOUND GUIDANCE VASC ACCESS RIGHT; IR PTA VENOUS EXCEPT DIALYSIS CIRCUIT ANESTHESIA/SEDATION: Intravenous Fentanyl 49mcg and Versed 1.5mg  were administered as conscious sedation during continuous monitoring of the patient's level of consciousness and physiological / cardiorespiratory status by the radiology RN, with Lon Klippel total moderate sedation time of 48 minutes. MEDICATIONS: Lidocaine 1% subcutaneous CONTRAST:  29mL OMNIPAQUE IOHEXOL 300 MG/ML  SOLN PROCEDURE: The procedure, risks (including but not limited to bleeding, infection, organ damage ), benefits, and alternatives were explained to the patient. Questions regarding the procedure were encouraged and answered. The patient understands and consents to the procedure. Patient placed prone on the procedure table. Right leg prepped and draped in usual sterile fashion. Maximal barrier sterile technique was utilized including caps, mask, sterile gowns, sterile gloves, sterile drape, hand hygiene and skin antiseptic. After time-out, moderate sedation was initiated. After the skin was infiltrated with lidocaine, Cynthea Zachman right posterior tibial vein was accessed under ultrasound guidance with Dawn Convery 21 gauge micropuncture set at the proximal calf level. Venography was performed. The micropuncture dilator was  exchanged for an 8 [place] vascular sheath, through which Nohelani Benning 5 [place] Kumpe catheter was advanced for right lower extremity venography. The catheter was exchanged over Dhanya Bogle guidewire for the [facility] Scientific AngioJet ZelanteDVT 105cm x 8Fdevice, used for pulse spray mechanical thrombolysis of  iliofemoral DVT using 5 mg tPA in 100 mL sterile saline. The device was subsequently used for mechanical thrombectomy of the iliofemoral DVT . Intermittent contrast injections were utilized to guide mechanical thrombo lysis. The device was then exchanged over the guidewire for Lanora Reveron 12 mm x 4 cm atlas angioplasty balloon, used to macerate the thrombus through the right common femoral, external and common iliac veins. No high-grade stenosis was encountered. Antegrade flow was achieved, with moderate residual mural thrombus. 0 90/50 cm infusion catheter was then placed across regions of residual mural thrombus and catheter directed thrombo lytic infusion was initiated. The catheter and sheath were secured with an 0 silk suture and Steri-Strips the patient transferred to ICU for overnight infusion. The patient tolerated the procedure well. COMPLICATIONS: None immediate FINDINGS: Initial right lower extremity venography demonstrated occlusive DVT extending through the length of the right superficial femoral, common femoral, external iliac, and common iliac veins. IVC widely patent without thrombus. There was incomplete clearance of thrombus from the femoral and iliac veins with mechanical thrombolysis. The central thrombus responded easily to 12 mm balloon angioplasty. Antegrade flow through the length of the affected segment was restored, with scattered residual mural thrombus. Catheter directed pharmacologic thrombolysis was initiated. IMPRESSION: 1. Extensive occlusive acute thrombus through the right iliofemoral venous system. 2. Partial response to pulse spray mechanical thrombolysis and venous PTA, with restoration of flow through the affected segments. 3. Initiation of catheter directed thrombolytic infusion. PLAN: Overnight infusion with ICU observation. Follow-up venography in [place].m. Electronically Signed   By: Corlis Leak M.D.   On: 09/30/2019 15:48   IR US Guide Vasc Access Right  Result Date:  09/30/2019 CLINICAL DATA:  Right lower extremity swelling and pain. Recent diagnosis of acute pulmonary emboli. Venography and ultrasound demonstrate right iliofemoral occlusive DVT. EXAM: THROMBOECTOMY MECHANICAL VENOUS; RIGHT EXTREMITY VENOGRAPHY; INFERIOR VENA CAVOGRAM; IR INFUSION THROMBOL VENOUS INITIAL (MS); IR ULTRASOUND GUIDANCE VASC ACCESS RIGHT; IR PTA VENOUS EXCEPT DIALYSIS CIRCUIT ANESTHESIA/SEDATION: Intravenous Fentanyl and Versed 1.5mg  were administered as conscious sedation during continuous monitoring of the patient's level of consciousness and physiological / cardiorespiratory status by the radiology RN, with Adonna Horsley total moderate sedation time of 48 minutes. MEDICATIONS: Lidocaine 1% subcutaneous CONTRAST:  70mL OMNIPAQUE IOHEXOL 300 MG/ML  SOLN PROCEDURE: The procedure, risks (including but not limited to bleeding, infection, organ damage ), benefits, and alternatives were explained to the patient. Questions regarding the procedure were encouraged and answered. The patient understands and consents to the procedure. Patient placed prone on the procedure table. Right leg prepped and draped in usual sterile fashion. Maximal barrier sterile technique was utilized including caps, mask, sterile gowns, sterile gloves, sterile drape, hand hygiene and skin antiseptic. After time-out, moderate sedation was initiated. After the skin was infiltrated with lidocaine, Altamese Deguire right posterior tibial vein was accessed under ultrasound guidance with Elsie Sakuma 21 gauge micropuncture set at the proximal calf level. Venography was performed. The micropuncture dilator was exchanged for an 8 [place] vascular sheath, through which Joanann Mies 5 [place] Kumpe catheter was advanced for right lower extremity venography. The catheter was exchanged over Lauri Purdum guidewire for the [facility] Scientific AngioJet ZelanteDVT 105cm x 8Fdevice, used for pulse spray mechanical thrombolysis of iliofemoral DVT using 5 mg tPA in  100 mL sterile saline. The device was  subsequently used for mechanical thrombectomy of the iliofemoral DVT . Intermittent contrast injections were utilized to guide mechanical thrombo lysis. The device was then exchanged over the guidewire for Bauer Ausborn 12 mm x 4 cm atlas angioplasty balloon, used to macerate the thrombus through the right common femoral, external and common iliac veins. No high-grade stenosis was encountered. Antegrade flow was achieved, with moderate residual mural thrombus. 0 90/50 cm infusion catheter was then placed across regions of residual mural thrombus and catheter directed thrombo lytic infusion was initiated. The catheter and sheath were secured with an 0 silk suture and Steri-Strips the patient transferred to ICU for overnight infusion. The patient tolerated the procedure well. COMPLICATIONS: None immediate FINDINGS: Initial right lower extremity venography demonstrated occlusive DVT extending through the length of the right superficial femoral, common femoral, external iliac, and common iliac veins. IVC widely patent without thrombus. There was incomplete clearance of thrombus from the femoral and iliac veins with mechanical thrombolysis. The central thrombus responded easily to 12 mm balloon angioplasty. Antegrade flow through the length of the affected segment was restored, with scattered residual mural thrombus. Catheter directed pharmacologic thrombolysis was initiated. IMPRESSION: 1. Extensive occlusive acute thrombus through the right iliofemoral venous system. 2. Partial response to pulse spray mechanical thrombolysis and venous PTA, with restoration of flow through the affected segments. 3. Initiation of catheter directed thrombolytic infusion. PLAN: Overnight infusion with ICU observation. Follow-up venography in [place]ia Sherlin.m. Electronically Signed   By: Corlis Leak M.D.   On: 09/30/2019 15:48   IR PTA VENOUS EXCEPT DIALYSIS CIRCUIT  Result Date: 10/01/2019 INDICATION: 24 hours status post initiation of transcatheter  thrombolytic therapy after mechanical thrombectomy and angioplasty to treat extensive right iliofemoral DVT. EXAM: 1. FOLLOW-UP DURING VENOUS THROMBOLYTIC THERAPY OF THE RIGHT LOWER EXTREMITY, FINAL DAY 2. VENOUS ANGIOPLASTY OF THE RIGHT FEMORAL VEIN COMPARISON:  09/30/2019 MEDICATIONS: None. ANESTHESIA/SEDATION: Versed 1.0 mg IV; Fentanyl 50 mcg IV Moderate Sedation Time:  40 minutes. The patient was continuously monitored during the procedure by the interventional radiology nurse under my direct supervision. FLUOROSCOPY TIME:  Fluoroscopy Time: 5 minutes and 54 seconds. 162.0 mGy. COMPLICATIONS: None immediate. TECHNIQUE: Informed written consent was obtained from the patient after Jermery Caratachea thorough discussion of the procedural risks, benefits and alternatives. All questions were addressed. Maximal Sterile Barrier Technique was utilized including caps, mask, sterile gowns, sterile gloves, sterile drape, hand hygiene and skin antiseptic. Bettie Capistran timeout was performed prior to the initiation of the procedure. Initial venography was performed through the pre-existing infusion catheter with visualization of the entire deep venous system of the right lower extremity beginning at the level of the popliteal vein and extending through the level of the common iliac vein. Balloon angioplasty of the upper thigh segment of the right femoral vein was then performed with Mariella Blackwelder 9 mm x 4 cm Conquest balloon after removal of the infusion catheter over Jasmin Winberry wire. Additional venography was performed. Additional balloon angioplasty of the mid and distal thigh segments of the right femoral vein were then performed with an 8 mm x 4 cm Conquest balloon. Additional venography was performed through the popliteal venous sheath. The sheath was then removed and hemostasis obtained with manual compression. FINDINGS: Follow-up venography approximately 24 hours after initiation of venous thrombolytic therapy demonstrates significant improvement in patency of the  right femoral, common femoral, external iliac and common iliac veins. There remains multiple areas of irregular narrowing of the femoral vein likely related to adherent chronic  thrombus. Barbie Croston more focal stenosis of the femoral vein is also present just inferior to the lesser trochanter of the femur. After initial proximal femoral vein angioplasty, there remained somewhat poor antegrade flow through the femoral venous segment. Additional angioplasty was therefore performed of the mid and distal femoral vein segments. This resulted in improved antegrade flow throughout the femoral vein. On completion, there were no significant areas venous stenosis present throughout the deep venous system of the right lower extremity. IMPRESSION: Completion of thrombolytic therapy to treat extensive right iliofemoral DVT. After overnight lytic therapy, there is improved venous patency. Areas of residual narrowing of the right femoral vein were successfully treated with 8 mm and 9 mm balloon angioplasty with improved venous patency and improved antegrade flow present on completion. Electronically Signed   By: Irish Lack M.D.   On: 10/01/2019 12:24   IR PTA VENOUS EXCEPT DIALYSIS CIRCUIT  Result Date: 09/30/2019 CLINICAL DATA:  Right lower extremity swelling and pain. Recent diagnosis of acute pulmonary emboli. Venography and ultrasound demonstrate right iliofemoral occlusive DVT. EXAM: THROMBOECTOMY MECHANICAL VENOUS; RIGHT EXTREMITY VENOGRAPHY; INFERIOR VENA CAVOGRAM; IR INFUSION THROMBOL VENOUS INITIAL (MS); IR ULTRASOUND GUIDANCE VASC ACCESS RIGHT; IR PTA VENOUS EXCEPT DIALYSIS CIRCUIT ANESTHESIA/SEDATION: Intravenous Fentanyl and Versed 1.5mg  were administered as conscious sedation during continuous monitoring of the patient's level of consciousness and physiological / cardiorespiratory status by the radiology RN, with Harlee Eckroth total moderate sedation time of 48 minutes. MEDICATIONS: Lidocaine 1% subcutaneous CONTRAST:  70mL  OMNIPAQUE IOHEXOL 300 MG/ML  SOLN PROCEDURE: The procedure, risks (including but not limited to bleeding, infection, organ damage ), benefits, and alternatives were explained to the patient. Questions regarding the procedure were encouraged and answered. The patient understands and consents to the procedure. Patient placed prone on the procedure table. Right leg prepped and draped in usual sterile fashion. Maximal barrier sterile technique was utilized including caps, mask, sterile gowns, sterile gloves, sterile drape, hand hygiene and skin antiseptic. After time-out, moderate sedation was initiated. After the skin was infiltrated with lidocaine, Pier Bosher right posterior tibial vein was accessed under ultrasound guidance with Estefanny Moler 21 gauge micropuncture set at the proximal calf level. Venography was performed. The micropuncture dilator was exchanged for an 8 [place] vascular sheath, through which Ifeanyichukwu Wickham 5 [place] Kumpe catheter was advanced for right lower extremity venography. The catheter was exchanged over Lord Lancour guidewire for the [facility] Scientific AngioJet ZelanteDVT 105cm x 8Fdevice, used for pulse spray mechanical thrombolysis of iliofemoral DVT using 5 mg tPA in 100 mL sterile saline. The device was subsequently used for mechanical thrombectomy of the iliofemoral DVT . Intermittent contrast injections were utilized to guide mechanical thrombo lysis. The device was then exchanged over the guidewire for Darnisha Vernet 12 mm x 4 cm atlas angioplasty balloon, used to macerate the thrombus through the right common femoral, external and common iliac veins. No high-grade stenosis was encountered. Antegrade flow was achieved, with moderate residual mural thrombus. 0 90/50 cm infusion catheter was then placed across regions of residual mural thrombus and catheter directed thrombo lytic infusion was initiated. The catheter and sheath were secured with an 0 silk suture and Steri-Strips the patient transferred to ICU for overnight infusion. The patient  tolerated the procedure well. COMPLICATIONS: None immediate FINDINGS: Initial right lower extremity venography demonstrated occlusive DVT extending through the length of the right superficial femoral, common femoral, external iliac, and common iliac veins. IVC widely patent without thrombus. There was incomplete clearance of thrombus from the femoral and iliac veins with mechanical  thrombolysis. The central thrombus responded easily to 12 mm balloon angioplasty. Antegrade flow through the length of the affected segment was restored, with scattered residual mural thrombus. Catheter directed pharmacologic thrombolysis was initiated. IMPRESSION: 1. Extensive occlusive acute thrombus through the right iliofemoral venous system. 2. Partial response to pulse spray mechanical thrombolysis and venous PTA, with restoration of flow through the affected segments. 3. Initiation of catheter directed thrombolytic infusion. PLAN: Overnight infusion with ICU observation. Follow-up venography in Tarius Stangelo.m. Electronically Signed   By: Corlis Leak M.D.   On: 09/30/2019 15:48   ECHOCARDIOGRAM COMPLETE  Result Date: 10/02/2019    ECHOCARDIOGRAM REPORT   Patient Name:   Cristian Huang [facility] Date of Exam: 10/02/2019 Medical Rec #:  [id number]           Height:       69.0 in Accession #:    [id number]          Weight:       220.5 lb Date of Birth:  September 17, 1950           BSA:          2.153 m Patient Age:    68 years            BP:           118/77 mmHg Patient Gender: M                   HR:           56 bpm. Exam Location:  Inpatient Procedure: 2D Echo Indications:    Pulmonary embolism (HCC) [[id number]]  History:        Patient has no prior history of Echocardiogram examinations.                 Right leg DVT, confirmed pulmonary embolism, Thrombocytopenia,                 acute kidney injury.  Sonographer:    Leta Jungling RDCS Referring Phys: [phone] Dillen Belmontes CALDWELL POWELL JR IMPRESSIONS  1. Left ventricular ejection fraction, by estimation, is 60 to  65%. The left ventricle has normal function. The left ventricle has no regional wall motion abnormalities. Left ventricular diastolic parameters are consistent with Grade I diastolic dysfunction (impaired relaxation).  2. Right ventricular systolic function is normal. The right ventricular size is normal.  3. The mitral valve is grossly normal. No evidence of mitral valve regurgitation. No evidence of mitral stenosis.  4. The aortic valve is tricuspid. Aortic valve regurgitation is not visualized. No aortic stenosis is present. FINDINGS  Left Ventricle: Left ventricular ejection fraction, by estimation, is 60 to 65%. The left ventricle has normal function. The left ventricle has no regional wall motion abnormalities. The left ventricular internal cavity size was normal in size. There is  no left ventricular hypertrophy. Left ventricular diastolic parameters are consistent with Grade I diastolic dysfunction (impaired relaxation). Right Ventricle: The right ventricular size is normal. No increase in right ventricular wall thickness. Right ventricular systolic function is normal. Left Atrium: Left atrial size was normal in size. Right Atrium: Right atrial size was normal in size. Pericardium: There is no evidence of pericardial effusion. Presence of pericardial fat pad. Mitral Valve: The mitral valve is grossly normal. No evidence of mitral valve regurgitation. No evidence of mitral valve stenosis. Tricuspid Valve: The tricuspid valve is grossly normal. Tricuspid valve regurgitation is not demonstrated. No evidence of tricuspid stenosis. Aortic Valve: The aortic valve is tricuspid.  Aortic valve regurgitation is not visualized. No aortic stenosis is present. Pulmonic Valve: The pulmonic valve was grossly normal. Pulmonic valve regurgitation is trivial. No evidence of pulmonic stenosis. Aorta: The aortic root and ascending aorta are structurally normal, with no evidence of dilitation. Venous: The inferior vena cava was  not well visualized. IAS/Shunts: The atrial septum is grossly normal.  LEFT VENTRICLE PLAX 2D LVIDd:         4.00 cm LVIDs:         3.00 cm LV PW:         1.10 cm LV IVS:        1.10 cm LVOT diam:     2.10 cm LV SV:         47 LV SV Index:   22 LVOT Area:     3.46 cm  RIGHT VENTRICLE RV S prime:     19.40 cm/s TAPSE (M-mode): 2.4 cm LEFT ATRIUM             Index       RIGHT ATRIUM           Index LA diam:        4.30 cm 2.00 cm/m  RA Area:     11.20 cm LA Vol (A2C):   29.2 ml 13.56 ml/m RA Volume:   20.10 ml  9.33 ml/m LA Vol (A4C):   42.7 ml 19.83 ml/m LA Biplane Vol: 37.2 ml 17.28 ml/m  AORTIC VALVE LVOT Vmax:   69.80 cm/s LVOT Vmean:  48.500 cm/s LVOT VTI:    0.137 m  AORTA Ao Root diam: 3.40 cm Ao Asc diam:  3.40 cm MITRAL VALVE               TRICUSPID VALVE MV Area (PHT): 2.96 cm    TR Peak grad:   24.2 mmHg MV Decel Time: 256 msec    TR Vmax:        246.00 cm/s MV E velocity: 54.00 cm/s MV Danajah Birdsell velocity: 75.80 cm/s  SHUNTS MV E/Emaley Applin ratio:  0.71        Systemic VTI:  0.14 m                            Systemic Diam: 2.10 cm Lennie Odor MD Electronically signed by Lennie Odor MD Signature Date/Time: 10/02/2019/1:21:18 PM    Final    CT VENOGRAM ABD/PEL  Result Date: 09/27/2019 CLINICAL DATA:  Right lower extremity DVT. Pulmonary embolus. Shortness breath EXAM: CT ABDOMEN AND PELVIS WITH CONTRAST TECHNIQUE: Multidetector CT imaging of the abdomen and pelvis was performed using the standard protocol following bolus administration of intravenous contrast. Also, delayed images were obtained to assess the venous system. CONTRAST:  OMNIPAQUE IOHEXOL 350 MG/ML SOLN COMPARISON:  CT Halana Deisher chest from 08/30/2019 FINDINGS: Lower chest: Stable mild atelectasis in the lung bases. Right lower lobe pulmonary embolus is shown on image 1/2. Hepatobiliary: Unremarkable Pancreas: Unremarkable Spleen: Unremarkable Adrenals/Urinary Tract: The adrenal glands appear normal. No urothelial filling defect. No significant  abnormal renal enhancement. Stomach/Bowel: Unremarkable Vascular/Lymphatic: Aortoiliac atherosclerotic vascular disease. No pathologic adenopathy is identified. On venous phase images we demonstrate considerable deep vein thrombosis at involving the right external iliac vein and common femoral vein. There is no left-sided pelvic DVT. No thrombus immediately in the IVC. I do not observe thrombus in the right internal iliac vein. Reproductive: Unremarkable Other: No supplemental non-categorized findings. Musculoskeletal: There stranding along fascia planes in  the right upper thigh region anteriorly. Congenitally short pedicles in the lumbar spine with prominence of the epidural adipose tissue. There is thought to be degenerative disc disease at L4-5 likely causing considerable central narrowing of the thecal sac. Left foraminal impingement suspected at L4-5 at L5-S1. IMPRESSION: 1. Acute right lower lobe pulmonary embolus (as shown on recent CT chest). 2. Deep vein thrombosis filling the right external iliac vein and common femoral vein. No definite involvement of the common femoral vein or IVC. Edema stranding along fascia planes anteriorly in the right upper thighs likely related to the DVT. 3. Lower lumbar spondylosis and degenerative disc disease with suspected impingement at L4-5 and L5-S1. 4. Aortoiliac atherosclerotic vascular disease. Aortic Atherosclerosis (ICD10-I70.0). Electronically Signed   By: Gaylyn Rong M.D.   On: 09/27/2019 20:32   VAS [place] LOWER EXTREMITY VENOUS (DVT) (ONLY [facility] & [facility])  Result Date: 09/27/2019  Lower Venous DVTStudy Indications: Swelling.  Risk Factors: None identified. Limitations: Poor ultrasound/tissue interface and bowel gas. Comparison Study: No prior studies. Performing Technologist: Chanda Busing RVT  Examination Guidelines: Marvella Jenning complete evaluation includes B-mode imaging, spectral Doppler, color Doppler, and power Doppler as needed of all accessible portions of each  vessel. Bilateral testing is considered an integral part of Fleurette Woolbright complete examination. Limited examinations for reoccurring indications may be performed as noted. The reflux portion of the exam is performed with the patient in reverse Trendelenburg.  +---------+---------------+---------+-----------+----------+--------------+ RIGHT    CompressibilityPhasicitySpontaneityPropertiesThrombus Aging +---------+---------------+---------+-----------+----------+--------------+ CFV      None           No       No                   Acute          +---------+---------------+---------+-----------+----------+--------------+ FV Prox  None           No       No                   Acute          +---------+---------------+---------+-----------+----------+--------------+ FV Mid   None           No       No                   Acute          +---------+---------------+---------+-----------+----------+--------------+ FV DistalNone           No       No                   Acute          +---------+---------------+---------+-----------+----------+--------------+ PFV      None                                         Acute          +---------+---------------+---------+-----------+----------+--------------+ POP      Full           Yes      Yes                                 +---------+---------------+---------+-----------+----------+--------------+ PTV      Full                                                        +---------+---------------+---------+-----------+----------+--------------+  PERO                                                  Not visualized +---------+---------------+---------+-----------+----------+--------------+ Gastroc  Full                                                        +---------+---------------+---------+-----------+----------+--------------+ EIV      None           No       No                   Acute           +---------+---------------+---------+-----------+----------+--------------+ CIV                                                   Not visualized +---------+---------------+---------+-----------+----------+--------------+ The distal IVC appears patent.  +----+---------------+---------+-----------+----------+--------------+ LEFTCompressibilityPhasicitySpontaneityPropertiesThrombus Aging +----+---------------+---------+-----------+----------+--------------+ CFV Full           Yes      Yes                                 +----+---------------+---------+-----------+----------+--------------+     Summary: RIGHT: - Findings consistent with acute deep vein thrombosis involving the right external iliac vein, common femoral vein, right femoral vein, and right proximal profunda vein. - No cystic structure found in the popliteal fossa.  LEFT: - No evidence of common femoral vein obstruction.  *See table(s) above for measurements and observations. Electronically signed by Waverly Ferrari MD on 09/27/2019 at 1:07:17 PM.    Final    IR INFUSION THROMBOL VENOUS INITIAL (MS)  Result Date: 09/30/2019 CLINICAL DATA:  Right lower extremity swelling and pain. Recent diagnosis of acute pulmonary emboli. Venography and ultrasound demonstrate right iliofemoral occlusive DVT. EXAM: THROMBOECTOMY MECHANICAL VENOUS; RIGHT EXTREMITY VENOGRAPHY; INFERIOR VENA CAVOGRAM; IR INFUSION THROMBOL VENOUS INITIAL (MS); IR ULTRASOUND GUIDANCE VASC ACCESS RIGHT; IR PTA VENOUS EXCEPT DIALYSIS CIRCUIT ANESTHESIA/SEDATION: Intravenous Fentanyl and Versed 1.5mg  were administered as conscious sedation during continuous monitoring of the patient's level of consciousness and physiological / cardiorespiratory status by the radiology RN, with Baleria Wyman total moderate sedation time of 48 minutes. MEDICATIONS: Lidocaine 1% subcutaneous CONTRAST:  57mL OMNIPAQUE IOHEXOL 300 MG/ML  SOLN PROCEDURE: The procedure, risks (including but not  limited to bleeding, infection, organ damage ), benefits, and alternatives were explained to the patient. Questions regarding the procedure were encouraged and answered. The patient understands and consents to the procedure. Patient placed prone on the procedure table. Right leg prepped and draped in usual sterile fashion. Maximal barrier sterile technique was utilized including caps, mask, sterile gowns, sterile gloves, sterile drape, hand hygiene and skin antiseptic. After time-out, moderate sedation was initiated. After the skin was infiltrated with lidocaine, Zaylei Mullane right posterior tibial vein was accessed under ultrasound guidance with Jerold Yoss 21 gauge micropuncture set at the proximal calf level. Venography was performed. The micropuncture dilator was exchanged for an 8 [place] vascular sheath, through which Alysha Doolan 5 [place]  Kumpe catheter was advanced for right lower extremity venography. The catheter was exchanged over Shanae Luo guidewire for the [facility] Scientific AngioJet ZelanteDVT 105cm x 8Fdevice, used for pulse spray mechanical thrombolysis of iliofemoral DVT using 5 mg tPA in 100 mL sterile saline. The device was subsequently used for mechanical thrombectomy of the iliofemoral DVT . Intermittent contrast injections were utilized to guide mechanical thrombo lysis. The device was then exchanged over the guidewire for Yishai Rehfeld 12 mm x 4 cm atlas angioplasty balloon, used to macerate the thrombus through the right common femoral, external and common iliac veins. No high-grade stenosis was encountered. Antegrade flow was achieved, with moderate residual mural thrombus. 0 90/50 cm infusion catheter was then placed across regions of residual mural thrombus and catheter directed thrombo lytic infusion was initiated. The catheter and sheath were secured with an 0 silk suture and Steri-Strips the patient transferred to ICU for overnight infusion. The patient tolerated the procedure well. COMPLICATIONS: None immediate FINDINGS: Initial right  lower extremity venography demonstrated occlusive DVT extending through the length of the right superficial femoral, common femoral, external iliac, and common iliac veins. IVC widely patent without thrombus. There was incomplete clearance of thrombus from the femoral and iliac veins with mechanical thrombolysis. The central thrombus responded easily to 12 mm balloon angioplasty. Antegrade flow through the length of the affected segment was restored, with scattered residual mural thrombus. Catheter directed pharmacologic thrombolysis was initiated. IMPRESSION: 1. Extensive occlusive acute thrombus through the right iliofemoral venous system. 2. Partial response to pulse spray mechanical thrombolysis and venous PTA, with restoration of flow through the affected segments. 3. Initiation of catheter directed thrombolytic infusion. PLAN: Overnight infusion with ICU observation. Follow-up venography in Hardeep Reetz.m. Electronically Signed   By: Corlis Leak M.D.   On: 09/30/2019 15:48   IR THROMB F/U EVAL ART/VEN FINAL DAY (MS)  Result Date: 10/01/2019 INDICATION: 24 hours status post initiation of transcatheter thrombolytic therapy after mechanical thrombectomy and angioplasty to treat extensive right iliofemoral DVT. EXAM: 1. FOLLOW-UP DURING VENOUS THROMBOLYTIC THERAPY OF THE RIGHT LOWER EXTREMITY, FINAL DAY 2. VENOUS ANGIOPLASTY OF THE RIGHT FEMORAL VEIN COMPARISON:  09/30/2019 MEDICATIONS: None. ANESTHESIA/SEDATION: Versed 1.0 mg IV; Fentanyl 50 mcg IV Moderate Sedation Time:  40 minutes. The patient was continuously monitored during the procedure by the interventional radiology nurse under my direct supervision. FLUOROSCOPY TIME:  Fluoroscopy Time: 5 minutes and 54 seconds. 162.0 mGy. COMPLICATIONS: None immediate. TECHNIQUE: Informed written consent was obtained from the patient after Iviana Blasingame thorough discussion of the procedural risks, benefits and alternatives. All questions were addressed. Maximal Sterile Barrier Technique  was utilized including caps, mask, sterile gowns, sterile gloves, sterile drape, hand hygiene and skin antiseptic. Aum Caggiano timeout was performed prior to the initiation of the procedure. Initial venography was performed through the pre-existing infusion catheter with visualization of the entire deep venous system of the right lower extremity beginning at the level of the popliteal vein and extending through the level of the common iliac vein. Balloon angioplasty of the upper thigh segment of the right femoral vein was then performed with Lennox Dolberry 9 mm x 4 cm Conquest balloon after removal of the infusion catheter over Chelcie Estorga wire. Additional venography was performed. Additional balloon angioplasty of the mid and distal thigh segments of the right femoral vein were then performed with an 8 mm x 4 cm Conquest balloon. Additional venography was performed through the popliteal venous sheath. The sheath was then removed and hemostasis obtained with manual compression. FINDINGS: Follow-up venography approximately 24  hours after initiation of venous thrombolytic therapy demonstrates significant improvement in patency of the right femoral, common femoral, external iliac and common iliac veins. There remains multiple areas of irregular narrowing of the femoral vein likely related to adherent chronic thrombus. Barre Aydelott more focal stenosis of the femoral vein is also present just inferior to the lesser trochanter of the femur. After initial proximal femoral vein angioplasty, there remained somewhat poor antegrade flow through the femoral venous segment. Additional angioplasty was therefore performed of the mid and distal femoral vein segments. This resulted in improved antegrade flow throughout the femoral vein. On completion, there were no significant areas venous stenosis present throughout the deep venous system of the right lower extremity. IMPRESSION: Completion of thrombolytic therapy to treat extensive right iliofemoral DVT. After overnight  lytic therapy, there is improved venous patency. Areas of residual narrowing of the right femoral vein were successfully treated with 8 mm and 9 mm balloon angioplasty with improved venous patency and improved antegrade flow present on completion. Electronically Signed   By: Irish Lack M.D.   On: 10/01/2019 12:24    Microbiology: Recent Results (from the past 240 hour(s))  Respiratory Panel by RT PCR (Flu Alanta Scobey&B, Covid) - Nasopharyngeal Swab     Status: None   Collection Time: 09/27/19  1:38 PM   Specimen: Nasopharyngeal Swab  Result Value Ref Range Status   SARS Coronavirus 2 by RT PCR NEGATIVE NEGATIVE Final    Comment: (NOTE) SARS-CoV-2 target nucleic acids are NOT DETECTED. The SARS-CoV-2 RNA is generally detectable in upper respiratoy specimens during the acute phase of infection. The lowest concentration of SARS-CoV-2 viral copies this assay can detect is 131 copies/mL. Jamorian Dimaria negative result does not preclude SARS-Cov-2 infection and should not be used as the sole basis for treatment or other patient management decisions. Iyad Deroo negative result may occur with  improper specimen collection/handling, submission of specimen other than nasopharyngeal swab, presence of viral mutation(s) within the areas targeted by this assay, and inadequate number of viral copies (<131 copies/mL). Fiore Detjen negative result must be combined with clinical observations, patient history, and epidemiological information. The expected result is Negative. Fact Sheet for Patients:  https://www.moore.com/ Fact Sheet for Healthcare Providers:  https://www.young.biz/ This test is not yet ap proved or cleared by the [place] FDA and  has been authorized for detection and/or diagnosis of SARS-CoV-2 by FDA under an Emergency Use Authorization (EUA). This EUA will remain  in effect (meaning this test can be used) for the duration of the COVID-19 declaration under Section 564(b)(1) of  the Act, 21 U.S.C. section 360bbb-3(b)(1), unless the authorization is terminated or revoked sooner.    Influenza Jessic Standifer by PCR NEGATIVE NEGATIVE Final   Influenza B by PCR NEGATIVE NEGATIVE Final    Comment: (NOTE) The Xpert Xpress SARS-CoV-2/FLU/RSV assay is intended as an aid in  the diagnosis of influenza from Nasopharyngeal swab specimens and  should not be used as Jenilyn Magana sole basis for treatment. Nasal washings and  aspirates are unacceptable for Xpert Xpress SARS-CoV-2/FLU/RSV  testing. Fact Sheet for Patients: https://www.moore.com/ Fact Sheet for Healthcare Providers: https://www.young.biz/ This test is not yet approved or cleared by the [place] FDA and  has been authorized for detection and/or diagnosis of SARS-CoV-2 by  FDA under an Emergency Use Authorization (EUA). This EUA will remain  in effect (meaning this test can be used) for the duration of the  Covid-19 declaration under Section 564(b)(1) of the Act, 21  U.S.C. section 360bbb-3(b)(1), unless the authorization  is  terminated or revoked. Performed at [facility], [address], [place], [place] [address]   MRSA PCR Screening     Status: None   Collection Time: 09/29/19  4:58 PM   Specimen: Nasal Mucosa; Nasopharyngeal  Result Value Ref Range Status   MRSA by PCR NEGATIVE NEGATIVE Final    Comment:        The GeneXpert MRSA Assay (FDA approved for NASAL specimens only), is one component of [employer] comprehensive MRSA colonization surveillance program. It is not intended to diagnose MRSA infection nor to guide or monitor treatment for MRSA infections. Performed at [facility], [address] [place], [place] [address]      Labs: Basic Metabolic Panel: Recent Labs  Lab 09/29/19 [phone] 09/30/19 0127 09/30/19 0813 10/01/19 0606 10/02/19 0530  NA 138 136 138 137 138  K 3.8 3.7 3.8 3.9 3.9  CL 108 105 107 105 108  CO2 22 23 23  21* 23  GLUCOSE  113* 121* 116* 105* 112*  BUN 26* 24* 25* 14 12  CREATININE 1.09 1.34* 1.33* 1.17 0.99  CALCIUM 8.7* 8.2* 8.3* 8.4* 8.5*  MG  --  2.3  --  2.3 2.4  PHOS  --  3.8  --  3.7 3.7   Liver Function Tests: Recent Labs  Lab 09/27/19 1126 09/30/19 0127 10/01/19 0606 10/02/19 0530  AST 36 29 50* 33  ALT 39 29 31 31   ALKPHOS 68 58 53 56  BILITOT 1.8* 1.1 1.4* 1.1  PROT 8.2* 6.0* 5.7* 5.7*  ALBUMIN 4.5 3.1* 2.9* 2.9*   No results for input(s): LIPASE, AMYLASE in the last 168 hours. No results for input(s): AMMONIA in the last 168 hours. CBC: Recent Labs  Lab 09/27/19 1126 09/28/19 0249 09/30/19 1128 09/30/19 1732 09/30/19 2051 10/01/19 0606 10/02/19 0530  WBC 7.9   < > 7.0 5.9 6.1 6.3 5.0  NEUTROABS 6.0  --   --   --   --   --   --   HGB 16.0   < > 13.6 13.1 12.5* 12.5* 12.3*  HCT 48.2   < > 39.2 37.5* 36.1* 36.3* 35.9*  MCV 97.0   < > 95.1 92.8 93.8 94.3 94.7  PLT 108*   < > 137* 127* 125* 125* 134*   < > = values in this interval not displayed.   Cardiac Enzymes: No results for input(s): CKTOTAL, CKMB, CKMBINDEX, TROPONINI in the last 168 hours. BNP: BNP (last 3 results) Recent Labs    09/27/19 1126  BNP 28.3    ProBNP (last 3 results) No results for input(s): PROBNP in the last 8760 hours.  CBG: No results for input(s): GLUCAP in the last 168 hours.     Signed:  10/04/19 MD.  [facility] 10/02/2019, 5:25 PM

## 2019-10-02 NOTE — Progress Notes (Signed)
  Echocardiogram 2D Echocardiogram has been performed.  Leta Jungling M 10/02/2019, 9:36 AM

## 2019-10-05 ENCOUNTER — Other Ambulatory Visit: Payer: Self-pay | Admitting: Interventional Radiology

## 2019-10-05 DIAGNOSIS — I82409 Acute embolism and thrombosis of unspecified deep veins of unspecified lower extremity: Secondary | ICD-10-CM

## 2019-10-11 DIAGNOSIS — I82401 Acute embolism and thrombosis of unspecified deep veins of right lower extremity: Secondary | ICD-10-CM | POA: Diagnosis not present

## 2019-10-11 DIAGNOSIS — Z125 Encounter for screening for malignant neoplasm of prostate: Secondary | ICD-10-CM | POA: Diagnosis not present

## 2019-10-11 DIAGNOSIS — I2699 Other pulmonary embolism without acute cor pulmonale: Secondary | ICD-10-CM | POA: Diagnosis not present

## 2019-10-11 DIAGNOSIS — Z6832 Body mass index (BMI) 32.0-32.9, adult: Secondary | ICD-10-CM | POA: Diagnosis not present

## 2019-10-20 ENCOUNTER — Ambulatory Visit
Admission: RE | Admit: 2019-10-20 | Discharge: 2019-10-20 | Disposition: A | Discharge status: Home / Self Care | Payer: Federal, State, Local not specified - PPO | Source: Ambulatory Visit | Attending: Radiology | Admitting: Radiology

## 2019-10-20 ENCOUNTER — Encounter: Payer: Self-pay | Admitting: *Deleted

## 2019-10-20 ENCOUNTER — Ambulatory Visit
Admission: RE | Admit: 2019-10-20 | Discharge: 2019-10-20 | Disposition: A | Discharge status: Home / Self Care | Payer: Federal, State, Local not specified - PPO | Source: Ambulatory Visit | Attending: Interventional Radiology | Admitting: Interventional Radiology

## 2019-10-20 DIAGNOSIS — I82409 Acute embolism and thrombosis of unspecified deep veins of unspecified lower extremity: Secondary | ICD-10-CM

## 2019-10-20 DIAGNOSIS — I82411 Acute embolism and thrombosis of right femoral vein: Secondary | ICD-10-CM | POA: Diagnosis not present

## 2019-10-20 HISTORY — PX: IR RADIOLOGIST EVAL & MGMT: IMG5224

## 2019-10-20 NOTE — Progress Notes (Signed)
Patient ID: Cristian Huang, male   DOB: January 04, 1951, 69 y.o.   MRN: [id number]       Chief Complaint: Patient was seen in consultation today for follow-up right lower extremity DVT post lysis at the request of Turpin,Pamela  Referring Physician(s): JESSICA J LIVENGOOD    History of Present Illness: Cristian Huang is a 69 y.o. male known to our service from 09/27/2019 presentation with sudden onset right leg swelling, shortness of breath x1 day.  CT demonstrated bilateral pulmonary emboli, without right heart strain.  Patient denies smoking, history of DVT's recent illness (including COVID), long trips. Patient does endorse a grandson who has had DVT"s. Patient has received the first COVID vaccine several weeks ago. CT venogram demonstrated occlusive thrombus in the right common femoral and external iliac veins.  IVC patent. 09/30/2019 underwent pulse spray mechanical thrombolysis and venous PTA with only partial response of the thrombus, some restoration antegrade flow and initiation of catheter directed thrombolytic infusion 10/01/2019 venogram showed improved venous patency with 8 and 9 mm balloon PTA of residual stenoses in the femoral vein  He was discharged shortly thereafter on anticoagulation.  He has done well since discharge.  His right leg feels normal again.  No restrictions or symptoms with any activity.  He is basically asymptomatic.  Shortness of breath has resolved.   No past medical history on file.  Past Surgical History:  Procedure Laterality Date  . IR INFUSION THROMBOL VENOUS INITIAL (MS)  09/30/2019  . IR PTA VENOUS EXCEPT DIALYSIS CIRCUIT  09/30/2019  . IR PTA VENOUS EXCEPT DIALYSIS CIRCUIT  10/01/2019  . IR RADIOLOGIST EVAL & MGMT  10/20/2019  . IR THROMB F/U EVAL ART/VEN FINAL DAY (MS)  10/01/2019  . IR THROMBECT VENO MECH MOD SED  09/30/2019  . IR US GUIDE VASC ACCESS RIGHT  09/30/2019  . IR VENO/EXT/UNI RIGHT  09/30/2019  . IR VENOCAVAGRAM IVC  09/30/2019     Allergies: Patient has no known allergies.  Medications: Prior to Admission medications   Medication Sig Start Date End Date Taking? Authorizing Provider  apixaban (ELIQUIS) 5 MG TABS tablet Take 2 tablets (10 mg total) by mouth 2 (two) times daily for 3 days. And then transition to 5 mg twice daily 10/02/19 10/05/19  Zigmund Marcelis Wissner., MD  apixaban (ELIQUIS) 5 MG TABS tablet Take 1 tablet (5 mg total) by mouth 2 (two) times daily. 10/05/19 11/04/19  Zigmund Fischer Halley., MD  lisinopril (ZESTRIL) 10 MG tablet Take 10 mg by mouth daily. 09/25/19   [provider]  memantine (NAMENDA) 5 MG tablet Take 5 mg by mouth 2 (two) times daily.  09/26/19   [provider]  sertraline (ZOLOFT) 100 MG tablet Take 100 mg by mouth daily. 09/25/19   [provider]  traZODone (DESYREL) 50 MG tablet Take 50 mg by mouth at bedtime.  09/26/19   [provider]     Family History  Problem Relation Age of Onset  . Cancer Father     Social History   Socioeconomic History  . Marital status: Single    Spouse name: Not on file  . Number of children: Not on file  . Years of education: Not on file  . Highest education level: Not on file  Occupational History  . Not on file  Tobacco Use  . Smoking status: Never Smoker  . Smokeless tobacco: Never Used  Substance and Sexual Activity  . Alcohol use: Yes  . Drug use:  Never  . Sexual activity: Not on file  Other Topics Concern  . Not on file  Social History Narrative  . Not on file   Social Determinants of Health   Financial Resource Strain:   . Difficulty of Paying Living Expenses:   Food Insecurity:   . Worried About Programme researcher, broadcasting/film/video in the Last Year:   . Barista in the Last Year:   Transportation Needs:   . Freight forwarder (Medical):   Marland Kitchen Lack of Transportation (Non-Medical):   Physical Activity:   . Days of Exercise per Week:   . Minutes of Exercise per Session:   Stress:   . Feeling  of Stress :   Social Connections:   . Frequency of Communication with Friends and Family:   . Frequency of Social Gatherings with Friends and Family:   . Attends Religious Services:   . Active Member of Clubs or Organizations:   . Attends Banker Meetings:   Marland Kitchen Marital Status:     ECOG Status: 0 - Asymptomatic   Vital Signs: There were no vitals taken for this visit.  Physical Exam Constitutional: Oriented to person, place, and time. Well-developed and well-nourished. No distress.   HENT:  Head: Normocephalic and atraumatic.  Eyes: Conjunctivae and EOM are normal. Right eye exhibits no discharge. Left eye exhibits no discharge. No scleral icterus.  Neck: No JVD present.  Pulmonary/Chest: Effort normal. No stridor. No respiratory distress.  Abdomen: soft, non distended Neurological:  alert and oriented to person, place, and time.  Skin: Skin is warm and dry.  not diaphoretic.  Psychiatric:   normal mood and affect.   behavior is normal. Judgment and thought content normal.   Review of Systems: A 12 point ROS discussed and pertinent positives are indicated in the HPI above.  All other systems are negative.  Review of Systems Mallampati Score:     Imaging: CT Angio Chest PE W and/or Wo Contrast  Result Date: 09/27/2019 CLINICAL DATA:  New history of DVT with exertional shortness of breath EXAM: CT ANGIOGRAPHY CHEST WITH CONTRAST TECHNIQUE: Multidetector CT imaging of the chest was performed using the standard protocol during bolus administration of intravenous contrast. Multiplanar CT image reconstructions and MIPs were obtained to evaluate the vascular anatomy. CONTRAST:  OMNIPAQUE IOHEXOL 350 MG/ML SOLN COMPARISON:  None. FINDINGS: Cardiovascular: Thoracic aorta demonstrates a normal branching pattern. Scattered atherosclerotic calcifications are noted. No aneurysmal dilatation or dissection is seen. No cardiac enlargement is seen. Mild coronary calcifications  are seen. The pulmonary artery shows a normal branching pattern. New bilateral lower lobe pulmonary emboli are seen slightly greater on the right than the left. Right upper lobe pulmonary emboli are noted as well. No right heart strain is seen. Mediastinum/Nodes: Thoracic inlet is within normal limits. No sizable hilar or mediastinal adenopathy is noted. The esophagus as visualized is within normal limits. Lungs/Pleura: Lungs are well aerated bilaterally. Some dependent atelectatic changes are seen. No findings to suggest pulmonary infarct are noted at this time. No pleural effusion is seen. No sizable parenchymal nodules are noted. Upper Abdomen: Visualized upper abdomen shows no acute abnormality. Musculoskeletal: Mild degenerative change of the thoracic spine is seen. No acute bony abnormality is noted. Review of the MIP images confirms the above findings. IMPRESSION: New bilateral pulmonary emboli as described without right heart strain. Mild dependent atelectatic changes are seen. Aortic Atherosclerosis (ICD10-I70.0). These results were called by telephone at the time of interpretation on 09/27/2019  at 1:22 pm to provider Lynden Oxford , who verbally acknowledged these results. Electronically Signed   By: Alcide Clever M.D.   On: 09/27/2019 13:24   IR Veno/Ext/Uni Right  Result Date: 09/30/2019 CLINICAL DATA:  Right lower extremity swelling and pain. Recent diagnosis of acute pulmonary emboli. Venography and ultrasound demonstrate right iliofemoral occlusive DVT. EXAM: THROMBOECTOMY MECHANICAL VENOUS; RIGHT EXTREMITY VENOGRAPHY; INFERIOR VENA CAVOGRAM; IR INFUSION THROMBOL VENOUS INITIAL (MS); IR ULTRASOUND GUIDANCE VASC ACCESS RIGHT; IR PTA VENOUS EXCEPT DIALYSIS CIRCUIT ANESTHESIA/SEDATION: Intravenous Fentanyl and Versed 1.5mg  were administered as conscious sedation during continuous monitoring of the patient's level of consciousness and physiological / cardiorespiratory status by the radiology  RN, with a total moderate sedation time of 48 minutes. MEDICATIONS: Lidocaine 1% subcutaneous CONTRAST:  70mL OMNIPAQUE IOHEXOL 300 MG/ML  SOLN PROCEDURE: The procedure, risks (including but not limited to bleeding, infection, organ damage ), benefits, and alternatives were explained to the patient. Questions regarding the procedure were encouraged and answered. The patient understands and consents to the procedure. Patient placed prone on the procedure table. Right leg prepped and draped in usual sterile fashion. Maximal barrier sterile technique was utilized including caps, mask, sterile gowns, sterile gloves, sterile drape, hand hygiene and skin antiseptic. After time-out, moderate sedation was initiated. After the skin was infiltrated with lidocaine, a right posterior tibial vein was accessed under ultrasound guidance with a 21 gauge micropuncture set at the proximal calf level. Venography was performed. The micropuncture dilator was exchanged for an 8 [place] vascular sheath, through which a 5 [place] Kumpe catheter was advanced for right lower extremity venography. The catheter was exchanged over a guidewire for the [facility] Scientific AngioJet ZelanteDVT 105cm x 8Fdevice, used for pulse spray mechanical thrombolysis of iliofemoral DVT using 5 mg tPA in 100 mL sterile saline. The device was subsequently used for mechanical thrombectomy of the iliofemoral DVT . Intermittent contrast injections were utilized to guide mechanical thrombo lysis. The device was then exchanged over the guidewire for a 12 mm x 4 cm atlas angioplasty balloon, used to macerate the thrombus through the right common femoral, external and common iliac veins. No high-grade stenosis was encountered. Antegrade flow was achieved, with moderate residual mural thrombus. 0 90/50 cm infusion catheter was then placed across regions of residual mural thrombus and catheter directed thrombo lytic infusion was initiated. The catheter and sheath were  secured with an 0 silk suture and Steri-Strips the patient transferred to ICU for overnight infusion. The patient tolerated the procedure well. COMPLICATIONS: None immediate FINDINGS: Initial right lower extremity venography demonstrated occlusive DVT extending through the length of the right superficial femoral, common femoral, external iliac, and common iliac veins. IVC widely patent without thrombus. There was incomplete clearance of thrombus from the femoral and iliac veins with mechanical thrombolysis. The central thrombus responded easily to 12 mm balloon angioplasty. Antegrade flow through the length of the affected segment was restored, with scattered residual mural thrombus. Catheter directed pharmacologic thrombolysis was initiated. IMPRESSION: 1. Extensive occlusive acute thrombus through the right iliofemoral venous system. 2. Partial response to pulse spray mechanical thrombolysis and venous PTA, with restoration of flow through the affected segments. 3. Initiation of catheter directed thrombolytic infusion. PLAN: Overnight infusion with ICU observation. Follow-up venography in a.m. Electronically Signed   By: Corlis Leak M.D.   On: 09/30/2019 15:48   IR Venocavagram Ivc  Result Date: 09/30/2019 CLINICAL DATA:  Right lower extremity swelling and pain. Recent diagnosis of acute pulmonary emboli. Venography and ultrasound  demonstrate right iliofemoral occlusive DVT. EXAM: THROMBOECTOMY MECHANICAL VENOUS; RIGHT EXTREMITY VENOGRAPHY; INFERIOR VENA CAVOGRAM; IR INFUSION THROMBOL VENOUS INITIAL (MS); IR ULTRASOUND GUIDANCE VASC ACCESS RIGHT; IR PTA VENOUS EXCEPT DIALYSIS CIRCUIT ANESTHESIA/SEDATION: Intravenous Fentanyl and Versed 1.5mg  were administered as conscious sedation during continuous monitoring of the patient's level of consciousness and physiological / cardiorespiratory status by the radiology RN, with a total moderate sedation time of 48 minutes. MEDICATIONS: Lidocaine 1% subcutaneous  CONTRAST:  70mL OMNIPAQUE IOHEXOL 300 MG/ML  SOLN PROCEDURE: The procedure, risks (including but not limited to bleeding, infection, organ damage ), benefits, and alternatives were explained to the patient. Questions regarding the procedure were encouraged and answered. The patient understands and consents to the procedure. Patient placed prone on the procedure table. Right leg prepped and draped in usual sterile fashion. Maximal barrier sterile technique was utilized including caps, mask, sterile gowns, sterile gloves, sterile drape, hand hygiene and skin antiseptic. After time-out, moderate sedation was initiated. After the skin was infiltrated with lidocaine, a right posterior tibial vein was accessed under ultrasound guidance with a 21 gauge micropuncture set at the proximal calf level. Venography was performed. The micropuncture dilator was exchanged for an 8 [place] vascular sheath, through which a 5 [place] Kumpe catheter was advanced for right lower extremity venography. The catheter was exchanged over a guidewire for the [facility] Scientific AngioJet ZelanteDVT 105cm x 8Fdevice, used for pulse spray mechanical thrombolysis of iliofemoral DVT using 5 mg tPA in 100 mL sterile saline. The device was subsequently used for mechanical thrombectomy of the iliofemoral DVT . Intermittent contrast injections were utilized to guide mechanical thrombo lysis. The device was then exchanged over the guidewire for a 12 mm x 4 cm atlas angioplasty balloon, used to macerate the thrombus through the right common femoral, external and common iliac veins. No high-grade stenosis was encountered. Antegrade flow was achieved, with moderate residual mural thrombus. 0 90/50 cm infusion catheter was then placed across regions of residual mural thrombus and catheter directed thrombo lytic infusion was initiated. The catheter and sheath were secured with an 0 silk suture and Steri-Strips the patient transferred to ICU for overnight  infusion. The patient tolerated the procedure well. COMPLICATIONS: None immediate FINDINGS: Initial right lower extremity venography demonstrated occlusive DVT extending through the length of the right superficial femoral, common femoral, external iliac, and common iliac veins. IVC widely patent without thrombus. There was incomplete clearance of thrombus from the femoral and iliac veins with mechanical thrombolysis. The central thrombus responded easily to 12 mm balloon angioplasty. Antegrade flow through the length of the affected segment was restored, with scattered residual mural thrombus. Catheter directed pharmacologic thrombolysis was initiated. IMPRESSION: 1. Extensive occlusive acute thrombus through the right iliofemoral venous system. 2. Partial response to pulse spray mechanical thrombolysis and venous PTA, with restoration of flow through the affected segments. 3. Initiation of catheter directed thrombolytic infusion. PLAN: Overnight infusion with ICU observation. Follow-up venography in a.m. Electronically Signed   By: Corlis Leak M.D.   On: 09/30/2019 15:48   US Venous Img Lower Unilateral Right (DVT)  Result Date: 10/20/2019 CLINICAL DATA:  History of right lower extremity History of right lower extremity DVT, post DVT, post mechanical and catheter-directed catheter-directed pharmacologic thrombolysis 3/26-27/21. EXAM: RIGHT LOWER EXTREMITY VENOUS DOPPLER ULTRASOUND TECHNIQUE: Gray-scale sonography with compression, as well as color and duplex ultrasound, were performed to evaluate the deep venous system(s) from the level of the common femoral vein through the popliteal and proximal calf veins. COMPARISON:  Venogram 10/01/2019 FINDINGS: VENOUS Normal compressibility of the common femoral and popliteal veins, as well as the visualized calf veins. 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 VIsualized portions of profunda femoral vein and great saphenous vein unremarkable. No evidence of acute DVT. Doppler waveforms show normal direction of venous flow, normal respiratory phasicity and response to augmentation. OTHER Survey views of the contralateral common femoral vein are unremarkable. Limitations: none IMPRESSION: 1. 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 No evidence of acute DVT. Electronically Signed   By: Corlis Leak M.D.   On: 10/20/2019 14:50   IR THROMBECT VENO MECH MOD SED  Result Date: 09/30/2019 CLINICAL DATA:  Right lower extremity swelling and pain. Recent diagnosis of acute pulmonary emboli. Venography and ultrasound demonstrate right iliofemoral occlusive DVT. EXAM: THROMBOECTOMY MECHANICAL VENOUS; RIGHT EXTREMITY VENOGRAPHY; INFERIOR VENA CAVOGRAM; IR INFUSION THROMBOL VENOUS INITIAL (MS); IR ULTRASOUND GUIDANCE VASC ACCESS RIGHT; IR PTA VENOUS EXCEPT DIALYSIS CIRCUIT ANESTHESIA/SEDATION: Intravenous Fentanyl and Versed 1.5mg  were administered as conscious sedation during continuous monitoring of the patient's level of consciousness and physiological / cardiorespiratory status by the radiology RN, with a total moderate sedation time of 48 minutes. MEDICATIONS: Lidocaine 1% subcutaneous CONTRAST:  76mL OMNIPAQUE IOHEXOL 300 MG/ML  SOLN PROCEDURE: The procedure, risks (including but not limited to bleeding, infection, organ damage ), benefits, and alternatives were explained to the patient. Questions regarding the procedure were encouraged and answered. The patient understands and consents to the procedure. Patient placed prone on the procedure table. Right leg prepped and draped in usual sterile fashion. Maximal barrier sterile technique was utilized including caps, mask, sterile gowns, sterile gloves, sterile drape, hand hygiene and skin antiseptic. After time-out, moderate sedation was initiated.  After the skin was infiltrated with lidocaine, a right posterior tibial vein was accessed under ultrasound guidance with a 21 gauge micropuncture set at the proximal calf level. Venography was performed. The micropuncture dilator was exchanged for an 8 [place] vascular sheath, through which a 5 [place] Kumpe catheter was advanced for right lower extremity venography. The catheter was exchanged over a guidewire for the [facility] Scientific AngioJet ZelanteDVT 105cm x 8Fdevice, used for pulse spray mechanical thrombolysis of iliofemoral DVT using 5 mg tPA in 100 mL sterile saline. The device was subsequently used for mechanical thrombectomy of the iliofemoral DVT . Intermittent contrast injections were utilized to guide mechanical thrombo lysis. The device was then exchanged over the guidewire for a 12 mm x 4 cm atlas angioplasty balloon, used to macerate the thrombus through the right common femoral, external and common iliac veins. No high-grade stenosis was encountered. Antegrade flow was achieved, with moderate residual mural thrombus. 0 90/50 cm infusion catheter was then placed across regions of residual mural thrombus and catheter directed thrombo lytic infusion was initiated. The catheter and sheath were secured with an 0 silk suture and Steri-Strips the patient transferred to ICU for overnight infusion. The patient tolerated the procedure well. COMPLICATIONS: None immediate FINDINGS: Initial right lower extremity venography demonstrated occlusive DVT extending through the length of the right superficial femoral, common femoral, external iliac, and common iliac veins. IVC widely patent without thrombus. There was incomplete clearance of thrombus from the femoral and iliac veins with mechanical thrombolysis. The central thrombus responded easily to 12 mm balloon angioplasty. Antegrade flow through the length of  the affected segment was restored, with scattered residual mural thrombus. Catheter directed pharmacologic  thrombolysis was initiated. IMPRESSION: 1. Extensive occlusive acute thrombus through the right iliofemoral venous system. 2. Partial response to pulse spray mechanical thrombolysis and venous PTA, with restoration of flow through the affected segments. 3. Initiation of catheter directed thrombolytic infusion. PLAN: Overnight infusion with ICU observation. Follow-up venography in a.m. Electronically Signed   By: Corlis Leak M.D.   On: 09/30/2019 15:48   IR US Guide Vasc Access Right  Result Date: 09/30/2019 CLINICAL DATA:  Right lower extremity swelling and pain. Recent diagnosis of acute pulmonary emboli. Venography and ultrasound demonstrate right iliofemoral occlusive DVT. EXAM: THROMBOECTOMY MECHANICAL VENOUS; RIGHT EXTREMITY VENOGRAPHY; INFERIOR VENA CAVOGRAM; IR INFUSION THROMBOL VENOUS INITIAL (MS); IR ULTRASOUND GUIDANCE VASC ACCESS RIGHT; IR PTA VENOUS EXCEPT DIALYSIS CIRCUIT ANESTHESIA/SEDATION: Intravenous Fentanyl and Versed 1.5mg  were administered as conscious sedation during continuous monitoring of the patient's level of consciousness and physiological / cardiorespiratory status by the radiology RN, with a total moderate sedation time of 48 minutes. MEDICATIONS: Lidocaine 1% subcutaneous CONTRAST:  44mL OMNIPAQUE IOHEXOL 300 MG/ML  SOLN PROCEDURE: The procedure, risks (including but not limited to bleeding, infection, organ damage ), benefits, and alternatives were explained to the patient. Questions regarding the procedure were encouraged and answered. The patient understands and consents to the procedure. Patient placed prone on the procedure table. Right leg prepped and draped in usual sterile fashion. Maximal barrier sterile technique was utilized including caps, mask, sterile gowns, sterile gloves, sterile drape, hand hygiene and skin antiseptic. After time-out, moderate sedation was initiated. After the skin was infiltrated with lidocaine, a right posterior tibial vein was accessed  under ultrasound guidance with a 21 gauge micropuncture set at the proximal calf level. Venography was performed. The micropuncture dilator was exchanged for an 8 [place] vascular sheath, through which a 5 [place] Kumpe catheter was advanced for right lower extremity venography. The catheter was exchanged over a guidewire for the [facility] Scientific AngioJet ZelanteDVT 105cm x 8Fdevice, used for pulse spray mechanical thrombolysis of iliofemoral DVT using 5 mg tPA in 100 mL sterile saline. The device was subsequently used for mechanical thrombectomy of the iliofemoral DVT . Intermittent contrast injections were utilized to guide mechanical thrombo lysis. The device was then exchanged over the guidewire for a 12 mm x 4 cm atlas angioplasty balloon, used to macerate the thrombus through the right common femoral, external and common iliac veins. No high-grade stenosis was encountered. Antegrade flow was achieved, with moderate residual mural thrombus. 0 90/50 cm infusion catheter was then placed across regions of residual mural thrombus and catheter directed thrombo lytic infusion was initiated. The catheter and sheath were secured with an 0 silk suture and Steri-Strips the patient transferred to ICU for overnight infusion. The patient tolerated the procedure well. COMPLICATIONS: None immediate FINDINGS: Initial right lower extremity venography demonstrated occlusive DVT extending through the length of the right superficial femoral, common femoral, external iliac, and common iliac veins. IVC widely patent without thrombus. There was incomplete clearance of thrombus from the femoral and iliac veins with mechanical thrombolysis. The central thrombus responded easily to 12 mm balloon angioplasty. Antegrade flow through the length of the affected segment was restored, with scattered residual mural thrombus. Catheter directed pharmacologic thrombolysis was initiated. IMPRESSION: 1. Extensive occlusive acute thrombus through the  right iliofemoral venous system. 2. Partial response to pulse spray mechanical thrombolysis and venous PTA, with restoration of flow through the affected segments. 3. Initiation of  catheter directed thrombolytic infusion. PLAN: Overnight infusion with ICU observation. Follow-up venography in a.m. Electronically Signed   By: Corlis Leak  Kammi Hechler M.D.   On: 09/30/2019 15:48   IR PTA VENOUS EXCEPT DIALYSIS CIRCUIT  Result Date: 10/01/2019 INDICATION: 24 hours status post initiation of transcatheter thrombolytic therapy after mechanical thrombectomy and angioplasty to treat extensive right iliofemoral DVT. EXAM: 1. FOLLOW-UP DURING VENOUS THROMBOLYTIC THERAPY OF THE RIGHT LOWER EXTREMITY, FINAL DAY 2. VENOUS ANGIOPLASTY OF THE RIGHT FEMORAL VEIN COMPARISON:  09/30/2019 MEDICATIONS: None. ANESTHESIA/SEDATION: Versed 1.0 mg IV; Fentanyl 50 mcg IV Moderate Sedation Time:  40 minutes. The patient was continuously monitored during the procedure by the interventional radiology nurse under my direct supervision. FLUOROSCOPY TIME:  Fluoroscopy Time: 5 minutes and 54 seconds. 162.0 mGy. COMPLICATIONS: None immediate. TECHNIQUE: Informed written consent was obtained from the patient after a thorough discussion of the procedural risks, benefits and alternatives. All questions were addressed. Maximal Sterile Barrier Technique was utilized including caps, mask, sterile gowns, sterile gloves, sterile drape, hand hygiene and skin antiseptic. A timeout was performed prior to the initiation of the procedure. Initial venography was performed through the pre-existing infusion catheter with visualization of the entire deep venous system of the right lower extremity beginning at the level of the popliteal vein and extending through the level of the common iliac vein. Balloon angioplasty of the upper thigh segment of the right femoral vein was then performed with a 9 mm x 4 cm Conquest balloon after removal of the infusion catheter over a wire.  Additional venography was performed. Additional balloon angioplasty of the mid and distal thigh segments of the right femoral vein were then performed with an 8 mm x 4 cm Conquest balloon. Additional venography was performed through the popliteal venous sheath. The sheath was then removed and hemostasis obtained with manual compression. FINDINGS: Follow-up venography approximately 24 hours after initiation of venous thrombolytic therapy demonstrates significant improvement in patency of the right femoral, common femoral, external iliac and common iliac veins. There remains multiple areas of irregular narrowing of the femoral vein likely related to adherent chronic thrombus. A more focal stenosis of the femoral vein is also present just inferior to the lesser trochanter of the femur. After initial proximal femoral vein angioplasty, there remained somewhat poor antegrade flow through the femoral venous segment. Additional angioplasty was therefore performed of the mid and distal femoral vein segments. This resulted in improved antegrade flow throughout the femoral vein. On completion, there were no significant areas venous stenosis present throughout the deep venous system of the right lower extremity. IMPRESSION: Completion of thrombolytic therapy to treat extensive right iliofemoral DVT. After overnight lytic therapy, there is improved venous patency. Areas of residual narrowing of the right femoral vein were successfully treated with 8 mm and 9 mm balloon angioplasty with improved venous patency and improved antegrade flow present on completion. Electronically Signed   By: Irish LackGlenn  Yamagata M.D.   On: 10/01/2019 12:24   IR PTA VENOUS EXCEPT DIALYSIS CIRCUIT  Result Date: 09/30/2019 CLINICAL DATA:  Right lower extremity swelling and pain. Recent diagnosis of acute pulmonary emboli. Venography and ultrasound demonstrate right iliofemoral occlusive DVT. EXAM: THROMBOECTOMY MECHANICAL VENOUS; RIGHT EXTREMITY  VENOGRAPHY; INFERIOR VENA CAVOGRAM; IR INFUSION THROMBOL VENOUS INITIAL (MS); IR ULTRASOUND GUIDANCE VASC ACCESS RIGHT; IR PTA VENOUS EXCEPT DIALYSIS CIRCUIT ANESTHESIA/SEDATION: Intravenous Fentanyl 75mcg and Versed 1.5mg  were administered as conscious sedation during continuous monitoring of the patient's level of consciousness and physiological / cardiorespiratory status by the radiology RN,  with a total moderate sedation time of 48 minutes. MEDICATIONS: Lidocaine 1% subcutaneous CONTRAST:  70mL OMNIPAQUE IOHEXOL 300 MG/ML  SOLN PROCEDURE: The procedure, risks (including but not limited to bleeding, infection, organ damage ), benefits, and alternatives were explained to the patient. Questions regarding the procedure were encouraged and answered. The patient understands and consents to the procedure. Patient placed prone on the procedure table. Right leg prepped and draped in usual sterile fashion. Maximal barrier sterile technique was utilized including caps, mask, sterile gowns, sterile gloves, sterile drape, hand hygiene and skin antiseptic. After time-out, moderate sedation was initiated. After the skin was infiltrated with lidocaine, a right posterior tibial vein was accessed under ultrasound guidance with a 21 gauge micropuncture set at the proximal calf level. Venography was performed. The micropuncture dilator was exchanged for an 8 [place] vascular sheath, through which a 5 [place] Kumpe catheter was advanced for right lower extremity venography. The catheter was exchanged over a guidewire for the [facility] Scientific AngioJet ZelanteDVT 105cm x 8Fdevice, used for pulse spray mechanical thrombolysis of iliofemoral DVT using 5 mg tPA in 100 mL sterile saline. The device was subsequently used for mechanical thrombectomy of the iliofemoral DVT . Intermittent contrast injections were utilized to guide mechanical thrombo lysis. The device was then exchanged over the guidewire for a 12 mm x 4 cm atlas angioplasty  balloon, used to macerate the thrombus through the right common femoral, external and common iliac veins. No high-grade stenosis was encountered. Antegrade flow was achieved, with moderate residual mural thrombus. 0 90/50 cm infusion catheter was then placed across regions of residual mural thrombus and catheter directed thrombo lytic infusion was initiated. The catheter and sheath were secured with an 0 silk suture and Steri-Strips the patient transferred to ICU for overnight infusion. The patient tolerated the procedure well. COMPLICATIONS: None immediate FINDINGS: Initial right lower extremity venography demonstrated occlusive DVT extending through the length of the right superficial femoral, common femoral, external iliac, and common iliac veins. IVC widely patent without thrombus. There was incomplete clearance of thrombus from the femoral and iliac veins with mechanical thrombolysis. The central thrombus responded easily to 12 mm balloon angioplasty. Antegrade flow through the length of the affected segment was restored, with scattered residual mural thrombus. Catheter directed pharmacologic thrombolysis was initiated. IMPRESSION: 1. Extensive occlusive acute thrombus through the right iliofemoral venous system. 2. Partial response to pulse spray mechanical thrombolysis and venous PTA, with restoration of flow through the affected segments. 3. Initiation of catheter directed thrombolytic infusion. PLAN: Overnight infusion with ICU observation. Follow-up venography in a.m. Electronically Signed   By: Corlis Leak M.D.   On: 09/30/2019 15:48   ECHOCARDIOGRAM COMPLETE  Result Date: 10/02/2019    ECHOCARDIOGRAM REPORT   Patient Name:   Cristian Huang Date of Exam: 10/02/2019 Medical Rec #:  [id number]           Height:       69.0 in Accession #:    [id number]          Weight:       220.5 lb Date of Birth:  [birth year]           BSA:          2.153 m Patient Age:    68 years            BP:           118/77 mmHg  Patient Gender: M  HR:           56 bpm. Exam Location:  Inpatient Procedure: 2D Echo Indications:    Pulmonary embolism (HCC) [[id number]]  History:        Patient has no prior history of Echocardiogram examinations.                 Right leg DVT, confirmed pulmonary embolism, Thrombocytopenia,                 acute kidney injury.  Sonographer:    Leta Jungling RDCS Referring Phys: [phone] A CALDWELL POWELL JR IMPRESSIONS  1. Left ventricular ejection fraction, by estimation, is 60 to 65%. The left ventricle has normal function. The left ventricle has no regional wall motion abnormalities. Left ventricular diastolic parameters are consistent with Grade I diastolic dysfunction (impaired relaxation).  2. Right ventricular systolic function is normal. The right ventricular size is normal.  3. The mitral valve is grossly normal. No evidence of mitral valve regurgitation. No evidence of mitral stenosis.  4. The aortic valve is tricuspid. Aortic valve regurgitation is not visualized. No aortic stenosis is present. FINDINGS  Left Ventricle: Left ventricular ejection fraction, by estimation, is 60 to 65%. The left ventricle has normal function. The left ventricle has no regional wall motion abnormalities. The left ventricular internal cavity size was normal in size. There is  no left ventricular hypertrophy. Left ventricular diastolic parameters are consistent with Grade I diastolic dysfunction (impaired relaxation). Right Ventricle: The right ventricular size is normal. No increase in right ventricular wall thickness. Right ventricular systolic function is normal. Left Atrium: Left atrial size was normal in size. Right Atrium: Right atrial size was normal in size. Pericardium: There is no evidence of pericardial effusion. Presence of pericardial fat pad. Mitral Valve: The mitral valve is grossly normal. No evidence of mitral valve regurgitation. No evidence of mitral valve stenosis. Tricuspid Valve: The  tricuspid valve is grossly normal. Tricuspid valve regurgitation is not demonstrated. No evidence of tricuspid stenosis. Aortic Valve: The aortic valve is tricuspid. Aortic valve regurgitation is not visualized. No aortic stenosis is present. Pulmonic Valve: The pulmonic valve was grossly normal. Pulmonic valve regurgitation is trivial. No evidence of pulmonic stenosis. Aorta: The aortic root and ascending aorta are structurally normal, with no evidence of dilitation. Venous: The inferior vena cava was not well visualized. IAS/Shunts: The atrial septum is grossly normal.  LEFT VENTRICLE PLAX 2D LVIDd:         4.00 cm LVIDs:         3.00 cm LV PW:         1.10 cm LV IVS:        1.10 cm LVOT diam:     2.10 cm LV SV:         47 LV SV Index:   22 LVOT Area:     3.46 cm  RIGHT VENTRICLE RV S prime:     19.40 cm/s TAPSE (M-mode): 2.4 cm LEFT ATRIUM             Index       RIGHT ATRIUM           Index LA diam:        4.30 cm 2.00 cm/m  RA Area:     11.20 cm LA Vol (A2C):   29.2 ml 13.56 ml/m RA Volume:   20.10 ml  9.33 ml/m LA Vol (A4C):   42.7 ml 19.83 ml/m LA Biplane Vol: 37.2 ml 17.28  ml/m  AORTIC VALVE LVOT Vmax:   69.80 cm/s LVOT Vmean:  48.500 cm/s LVOT VTI:    0.137 m  AORTA Ao Root diam: 3.40 cm Ao Asc diam:  3.40 cm MITRAL VALVE               TRICUSPID VALVE MV Area (PHT): 2.96 cm    TR Peak grad:   24.2 mmHg MV Decel Time: 256 msec    TR Vmax:        246.00 cm/s MV E velocity: 54.00 cm/s MV A velocity: 75.80 cm/s  SHUNTS MV E/A ratio:  0.71        Systemic VTI:  0.14 m                            Systemic Diam: 2.10 cm Lennie Odor MD Electronically signed by Lennie Odor MD Signature Date/Time: 10/02/2019/1:21:18 PM    Final    CT VENOGRAM ABD/PEL  Result Date: 09/27/2019 CLINICAL DATA:  Right lower extremity DVT. Pulmonary embolus. Shortness breath EXAM: CT ABDOMEN AND PELVIS WITH CONTRAST TECHNIQUE: Multidetector CT imaging of the abdomen and pelvis was performed using the standard protocol  following bolus administration of intravenous contrast. Also, delayed images were obtained to assess the venous system. CONTRAST:  OMNIPAQUE IOHEXOL 350 MG/ML SOLN COMPARISON:  CT a chest from 08/30/2019 FINDINGS: Lower chest: Stable mild atelectasis in the lung bases. Right lower lobe pulmonary embolus is shown on image 1/2. Hepatobiliary: Unremarkable Pancreas: Unremarkable Spleen: Unremarkable Adrenals/Urinary Tract: The adrenal glands appear normal. No urothelial filling defect. No significant abnormal renal enhancement. Stomach/Bowel: Unremarkable Vascular/Lymphatic: Aortoiliac atherosclerotic vascular disease. No pathologic adenopathy is identified. On venous phase images we demonstrate considerable deep vein thrombosis at involving the right external iliac vein and common femoral vein. There is no left-sided pelvic DVT. No thrombus immediately in the IVC. I do not observe thrombus in the right internal iliac vein. Reproductive: Unremarkable Other: No supplemental non-categorized findings. Musculoskeletal: There stranding along fascia planes in the right upper thigh region anteriorly. Congenitally short pedicles in the lumbar spine with prominence of the epidural adipose tissue. There is thought to be degenerative disc disease at L4-5 likely causing considerable central narrowing of the thecal sac. Left foraminal impingement suspected at L4-5 at L5-S1. IMPRESSION: 1. Acute right lower lobe pulmonary embolus (as shown on recent CT chest). 2. Deep vein thrombosis filling the right external iliac vein and common femoral vein. No definite involvement of the common femoral vein or IVC. Edema stranding along fascia planes anteriorly in the right upper thighs likely related to the DVT. 3. Lower lumbar spondylosis and degenerative disc disease with suspected impingement at L4-5 and L5-S1. 4. Aortoiliac atherosclerotic vascular disease. Aortic Atherosclerosis (ICD10-I70.0). Electronically Signed   By: Gaylyn Rong M.D.   On: 09/27/2019 20:32   VAS [place] LOWER EXTREMITY VENOUS (DVT) (ONLY [facility] & [facility])  Result Date: 09/27/2019  Lower Venous DVTStudy Indications: Swelling.  Risk Factors: None identified. Limitations: Poor ultrasound/tissue interface and bowel gas. Comparison Study: No prior studies. Performing Technologist: Chanda Busing RVT  Examination Guidelines: A complete evaluation includes B-mode imaging, spectral Doppler, color Doppler, and power Doppler as needed of all accessible portions of each vessel. Bilateral testing is considered an integral part of a complete examination. Limited examinations for reoccurring indications may be performed as noted. The reflux portion of the exam is performed with the patient in reverse Trendelenburg.  +---------+---------------+---------+-----------+----------+--------------+ RIGHT  CompressibilityPhasicitySpontaneityPropertiesThrombus Aging +---------+---------------+---------+-----------+----------+--------------+ CFV      None           No       No                   Acute          +---------+---------------+---------+-----------+----------+--------------+ FV Prox  None           No       No                   Acute          +---------+---------------+---------+-----------+----------+--------------+ FV Mid   None           No       No                   Acute          +---------+---------------+---------+-----------+----------+--------------+ FV DistalNone           No       No                   Acute          +---------+---------------+---------+-----------+----------+--------------+ PFV      None                                         Acute          +---------+---------------+---------+-----------+----------+--------------+ POP      Full           Yes      Yes                                 +---------+---------------+---------+-----------+----------+--------------+ PTV      Full                                                         +---------+---------------+---------+-----------+----------+--------------+ PERO                                                  Not visualized +---------+---------------+---------+-----------+----------+--------------+ Gastroc  Full                                                        +---------+---------------+---------+-----------+----------+--------------+ EIV      None           No       No                   Acute          +---------+---------------+---------+-----------+----------+--------------+ CIV  Not visualized +---------+---------------+---------+-----------+----------+--------------+ The distal IVC appears patent.  +----+---------------+---------+-----------+----------+--------------+ LEFTCompressibilityPhasicitySpontaneityPropertiesThrombus Aging +----+---------------+---------+-----------+----------+--------------+ CFV Full           Yes      Yes                                 +----+---------------+---------+-----------+----------+--------------+     Summary: RIGHT: - Findings consistent with acute deep vein thrombosis involving the right external iliac vein, common femoral vein, right femoral vein, and right proximal profunda vein. - No cystic structure found in the popliteal fossa.  LEFT: - No evidence of common femoral vein obstruction.  *See table(s) above for measurements and observations. Electronically signed by Waverly Ferrari MD on 09/27/2019 at 1:07:17 PM.    Final    IR Radiologist Eval & Mgmt  Result Date: 10/20/2019 Please refer to notes tab for details about interventional procedure. (Op Note)  IR INFUSION THROMBOL VENOUS INITIAL (MS)  Result Date: 09/30/2019 CLINICAL DATA:  Right lower extremity swelling and pain. Recent diagnosis of acute pulmonary emboli. Venography and ultrasound demonstrate right iliofemoral occlusive DVT. EXAM: THROMBOECTOMY MECHANICAL  VENOUS; RIGHT EXTREMITY VENOGRAPHY; INFERIOR VENA CAVOGRAM; IR INFUSION THROMBOL VENOUS INITIAL (MS); IR ULTRASOUND GUIDANCE VASC ACCESS RIGHT; IR PTA VENOUS EXCEPT DIALYSIS CIRCUIT ANESTHESIA/SEDATION: Intravenous Fentanyl and Versed 1.5mg  were administered as conscious sedation during continuous monitoring of the patient's level of consciousness and physiological / cardiorespiratory status by the radiology RN, with a total moderate sedation time of 48 minutes. MEDICATIONS: Lidocaine 1% subcutaneous CONTRAST:  70mL OMNIPAQUE IOHEXOL 300 MG/ML  SOLN PROCEDURE: The procedure, risks (including but not limited to bleeding, infection, organ damage ), benefits, and alternatives were explained to the patient. Questions regarding the procedure were encouraged and answered. The patient understands and consents to the procedure. Patient placed prone on the procedure table. Right leg prepped and draped in usual sterile fashion. Maximal barrier sterile technique was utilized including caps, mask, sterile gowns, sterile gloves, sterile drape, hand hygiene and skin antiseptic. After time-out, moderate sedation was initiated. After the skin was infiltrated with lidocaine, a right posterior tibial vein was accessed under ultrasound guidance with a 21 gauge micropuncture set at the proximal calf level. Venography was performed. The micropuncture dilator was exchanged for an 8 [place] vascular sheath, through which a 5 [place] Kumpe catheter was advanced for right lower extremity venography. The catheter was exchanged over a guidewire for the [facility] Scientific AngioJet ZelanteDVT 105cm x 8Fdevice, used for pulse spray mechanical thrombolysis of iliofemoral DVT using 5 mg tPA in 100 mL sterile saline. The device was subsequently used for mechanical thrombectomy of the iliofemoral DVT . Intermittent contrast injections were utilized to guide mechanical thrombo lysis. The device was then exchanged over the guidewire for a 12 mm x 4  cm atlas angioplasty balloon, used to macerate the thrombus through the right common femoral, external and common iliac veins. No high-grade stenosis was encountered. Antegrade flow was achieved, with moderate residual mural thrombus. 0 90/50 cm infusion catheter was then placed across regions of residual mural thrombus and catheter directed thrombo lytic infusion was initiated. The catheter and sheath were secured with an 0 silk suture and Steri-Strips the patient transferred to ICU for overnight infusion. The patient tolerated the procedure well. COMPLICATIONS: None immediate FINDINGS: Initial right lower extremity venography demonstrated occlusive DVT extending through the length of the right superficial femoral, common femoral, external iliac, and common iliac veins. IVC widely patent without  thrombus. There was incomplete clearance of thrombus from the femoral and iliac veins with mechanical thrombolysis. The central thrombus responded easily to 12 mm balloon angioplasty. Antegrade flow through the length of the affected segment was restored, with scattered residual mural thrombus. Catheter directed pharmacologic thrombolysis was initiated. IMPRESSION: 1. Extensive occlusive acute thrombus through the right iliofemoral venous system. 2. Partial response to pulse spray mechanical thrombolysis and venous PTA, with restoration of flow through the affected segments. 3. Initiation of catheter directed thrombolytic infusion. PLAN: Overnight infusion with ICU observation. Follow-up venography in a.m. Electronically Signed   By: Lucrezia Europe M.D.   On: 09/30/2019 15:48   IR THROMB F/U EVAL ART/VEN FINAL DAY (MS)  Result Date: 10/01/2019 INDICATION: 24 hours status post initiation of transcatheter thrombolytic therapy after mechanical thrombectomy and angioplasty to treat extensive right iliofemoral DVT. EXAM: 1. FOLLOW-UP DURING VENOUS THROMBOLYTIC THERAPY OF THE RIGHT LOWER EXTREMITY, FINAL DAY 2. VENOUS ANGIOPLASTY  OF THE RIGHT FEMORAL VEIN COMPARISON:  09/30/2019 MEDICATIONS: None. ANESTHESIA/SEDATION: Versed 1.0 mg IV; Fentanyl 50 mcg IV Moderate Sedation Time:  40 minutes. The patient was continuously monitored during the procedure by the interventional radiology nurse under my direct supervision. FLUOROSCOPY TIME:  Fluoroscopy Time: 5 minutes and 54 seconds. 162.0 mGy. COMPLICATIONS: None immediate. TECHNIQUE: Informed written consent was obtained from the patient after a thorough discussion of the procedural risks, benefits and alternatives. All questions were addressed. Maximal Sterile Barrier Technique was utilized including caps, mask, sterile gowns, sterile gloves, sterile drape, hand hygiene and skin antiseptic. A timeout was performed prior to the initiation of the procedure. Initial venography was performed through the pre-existing infusion catheter with visualization of the entire deep venous system of the right lower extremity beginning at the level of the popliteal vein and extending through the level of the common iliac vein. Balloon angioplasty of the upper thigh segment of the right femoral vein was then performed with a 9 mm x 4 cm Conquest balloon after removal of the infusion catheter over a wire. Additional venography was performed. Additional balloon angioplasty of the mid and distal thigh segments of the right femoral vein were then performed with an 8 mm x 4 cm Conquest balloon. Additional venography was performed through the popliteal venous sheath. The sheath was then removed and hemostasis obtained with manual compression. FINDINGS: Follow-up venography approximately 24 hours after initiation of venous thrombolytic therapy demonstrates significant improvement in patency of the right femoral, common femoral, external iliac and common iliac veins. There remains multiple areas of irregular narrowing of the femoral vein likely related to adherent chronic thrombus. A more focal stenosis of the femoral  vein is also present just inferior to the lesser trochanter of the femur. After initial proximal femoral vein angioplasty, there remained somewhat poor antegrade flow through the femoral venous segment. Additional angioplasty was therefore performed of the mid and distal femoral vein segments. This resulted in improved antegrade flow throughout the femoral vein. On completion, there were no significant areas venous stenosis present throughout the deep venous system of the right lower extremity. IMPRESSION: Completion of thrombolytic therapy to treat extensive right iliofemoral DVT. After overnight lytic therapy, there is improved venous patency. Areas of residual narrowing of the right femoral vein were successfully treated with 8 mm and 9 mm balloon angioplasty with improved venous patency and improved antegrade flow present on completion. Electronically Signed   By: Aletta Edouard M.D.   On: 10/01/2019 12:24    Labs:  CBC: Recent Labs  09/30/19 1732 09/30/19 2051 10/01/19 0606 10/02/19 0530  WBC 5.9 6.1 6.3 5.0  HGB 13.1 12.5* 12.5* 12.3*  HCT 37.5* 36.1* 36.3* 35.9*  PLT 127* 125* 125* 134*    COAGS: No results for input(s): INR, APTT in the last 8760 hours.  BMP: Recent Labs    09/30/19 0127 09/30/19 0813 10/01/19 0606 10/02/19 0530  NA 136 138 137 138  K 3.7 3.8 3.9 3.9  CL 105 107 105 108  CO2 23 23 21* 23  GLUCOSE 121* 116* 105* 112*  BUN 24* 25* 14 12  CALCIUM 8.2* 8.3* 8.4* 8.5*  CREATININE 1.34* 1.33* 1.17 0.99  GFRNONAA 54* 54* >60 >60  GFRAA >60 >60 >60 >60    LIVER FUNCTION TESTS: Recent Labs    09/27/19 1126 09/30/19 0127 10/01/19 0606 10/02/19 0530  BILITOT 1.8* 1.1 1.4* 1.1  AST 36 29 50* 33  ALT 39 ALKPHOS 68 58 53 56  PROT 8.2* 6.0* 5.7* 5.7*  ALBUMIN 4.5 3.1* 2.9* 2.9*    TUMOR MARKERS: No results for input(s): AFPTM, CEA, CA199, CHROMGRNA in the last 8760 hours.  Assessment and Plan:  My impression is that the patient has  done very well after right extremity venous thrombolysis.  He is returned to his baseline asymptomatic status.  No complications with his current anticoagulation regimen.   Today's ultrasound looks good.  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  No acute DVT.  He is scheduled to see hematology after 3 months of anticoagulation therapy.  Because this is first-time DVT, with no Underlying Anatomic  etiology evident, no family history of clotting disorder, he may not have to stay on lifetime anticoagulation.  I encouraged him to go ahead and get his hematology appointment on the calendar so that there is no gap in continued anticoagulation treatment if needed. The patient seemed to understand, had all of his questions answered.  We can see him back on an as-needed basis.  He will plan to follow-up primarily with hematology regarding anticoagulation guidelines.  Thank you for this interesting consult.  I greatly enjoyed meeting Cristian Huang and look forward to participating in their care.  A copy of this report was sent to the requesting provider on this date.  Electronically Signed: Durwin Glaze 10/20/2019, 2:56 PM   I spent a total of    15 Minutes in face to face in clinical consultation, greater than 50% of which was counseling/coordinating care for right lower extremity DVT, post thrombolysis.

## 2019-12-27 DIAGNOSIS — K080 Exfoliation of teeth due to systemic causes: Secondary | ICD-10-CM | POA: Diagnosis not present

## 2020-01-13 DIAGNOSIS — E785 Hyperlipidemia, unspecified: Secondary | ICD-10-CM | POA: Diagnosis not present

## 2020-01-13 DIAGNOSIS — Z0000 Encounter for general adult medical examination without abnormal findings: Secondary | ICD-10-CM | POA: Diagnosis not present

## 2020-04-16 IMAGING — US US EXTREM LOW VENOUS*R*
1 series · 13 of 24 positions shown · non-contrast
Comparison: Venogram [DATE]

CLINICAL DATA: History of right lower extremity History of right
lower extremity DVT, post DVT, post mechanical and catheter-directed
catheter-directed pharmacologic thrombolysis [REDACTED]/21.

EXAM:
RIGHT LOWER EXTREMITY VENOUS DOPPLER ULTRASOUND
TECHNIQUE: Gray-scale sonography with compression, as well as color and duplex
ultrasound, were performed to evaluate the deep venous system(s)
from the level of the common femoral vein through the popliteal and
proximal calf veins.

[Series 1: us extrem low venous*right* · 0.08mm/px · 13 of 46 slices shown]
[im 1/46]
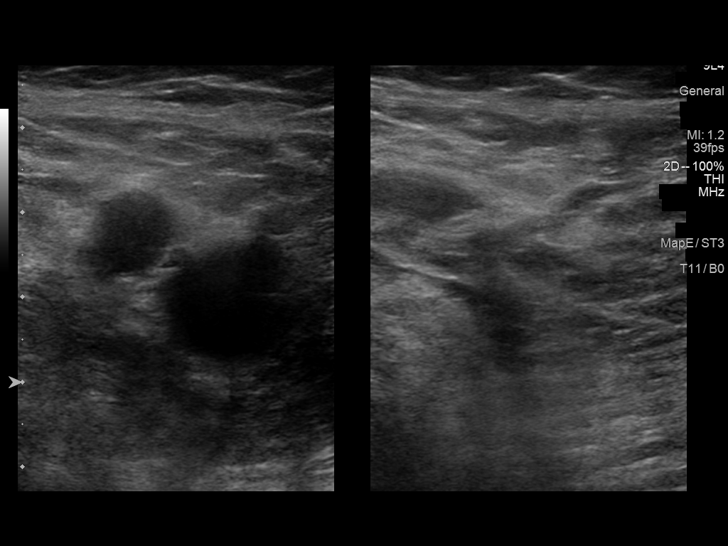
[im 4/46]
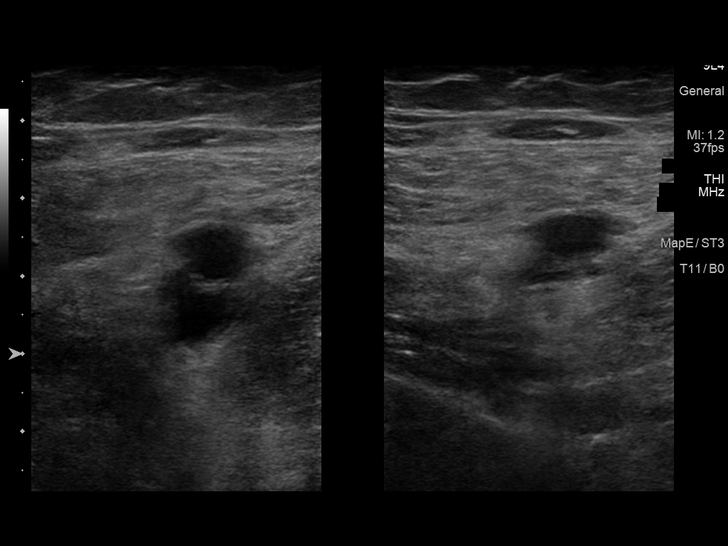
[im 8/46]
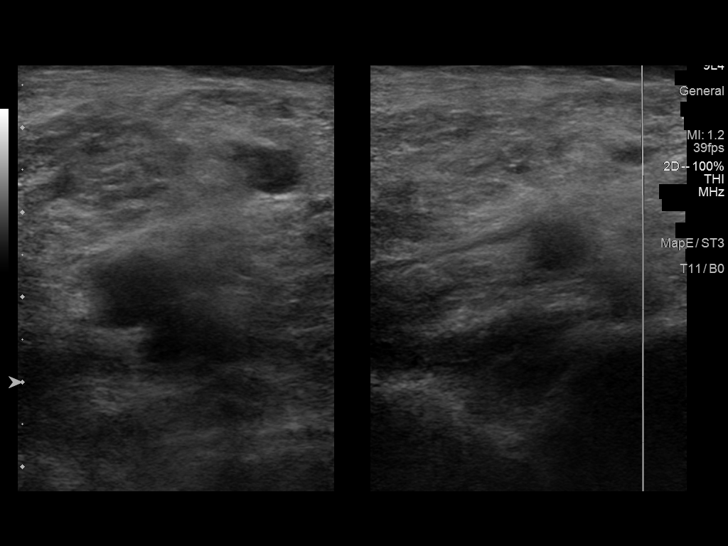
[im 12/46]
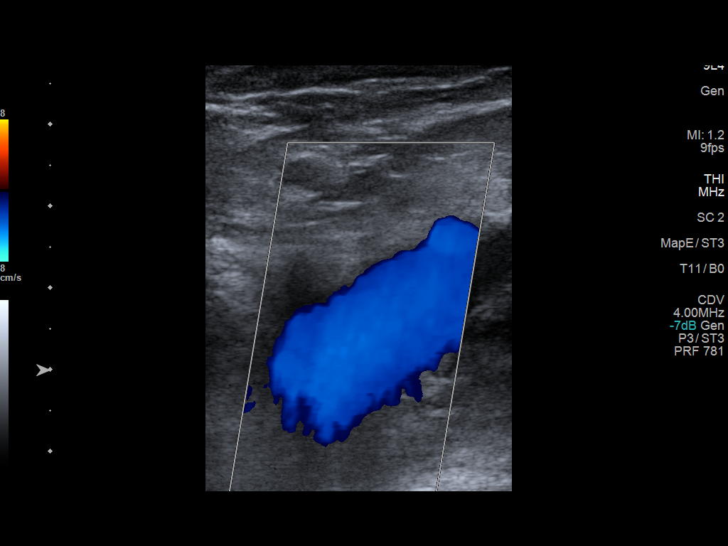
[im 16/46]
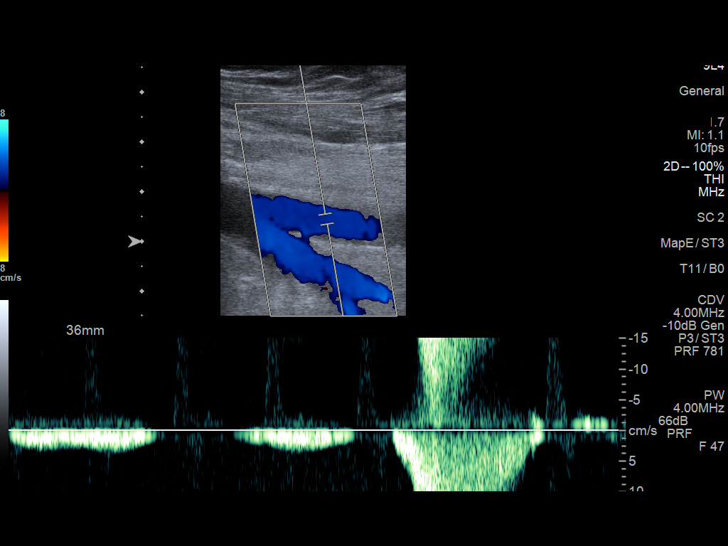
[im 20/46]
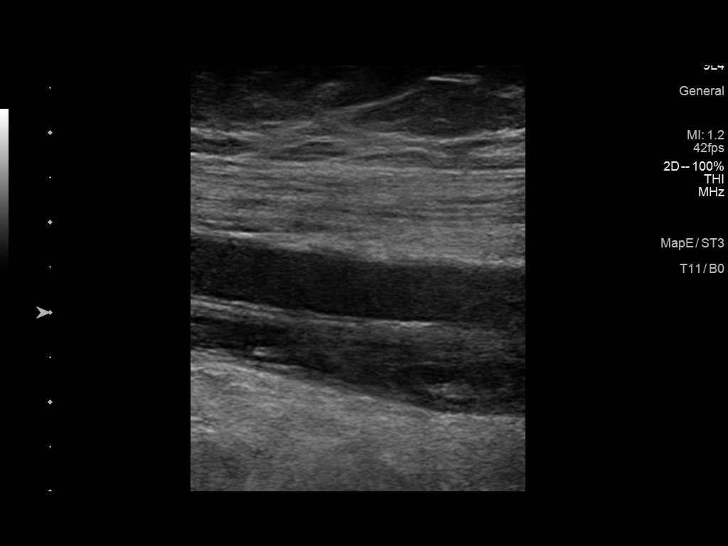
[im 24/46]
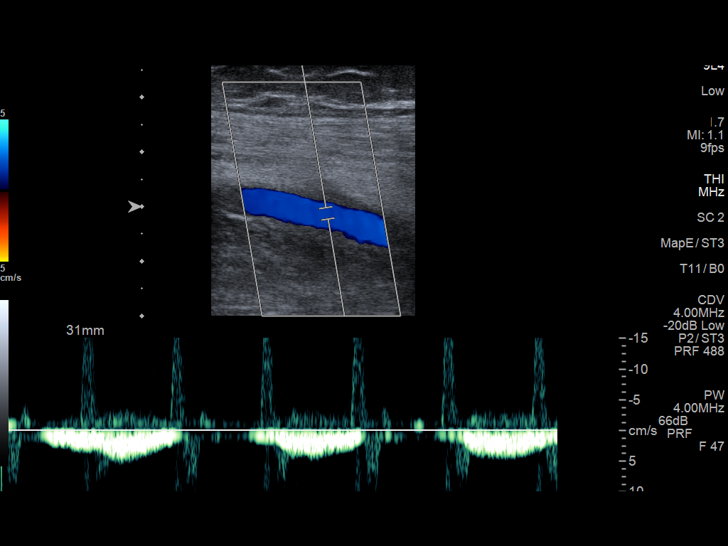
[im 26/46]
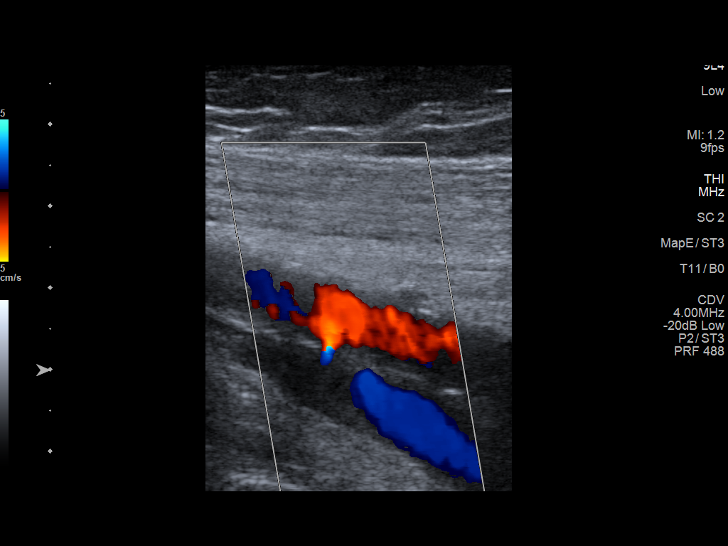
[im 30/46]
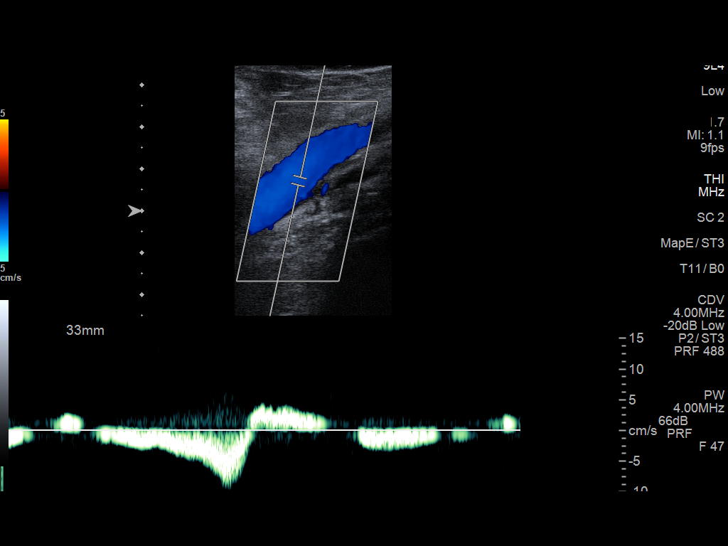
[im 34/46]
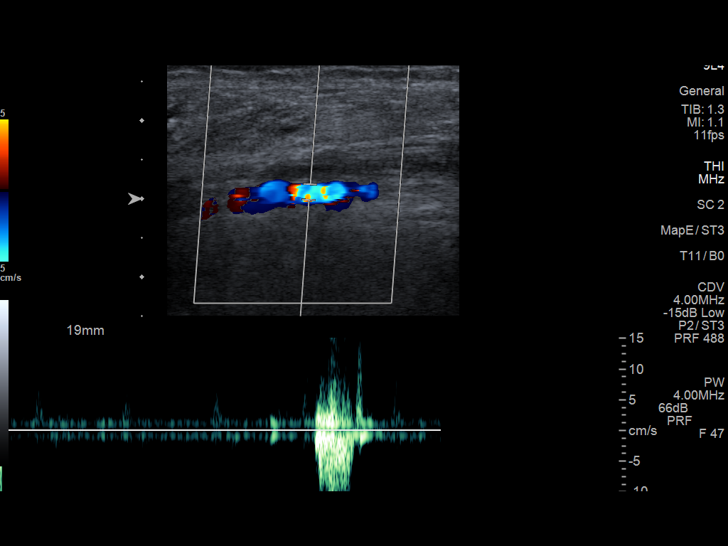
[im 38/46]
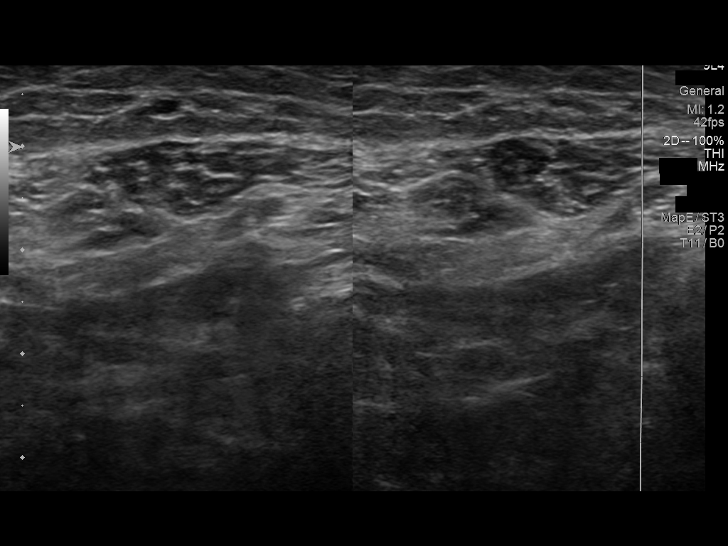
[im 42/46]
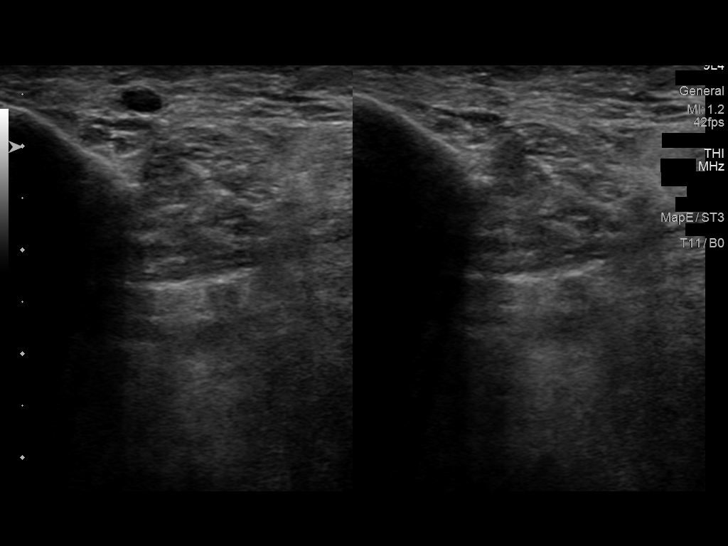
[im 46/46]
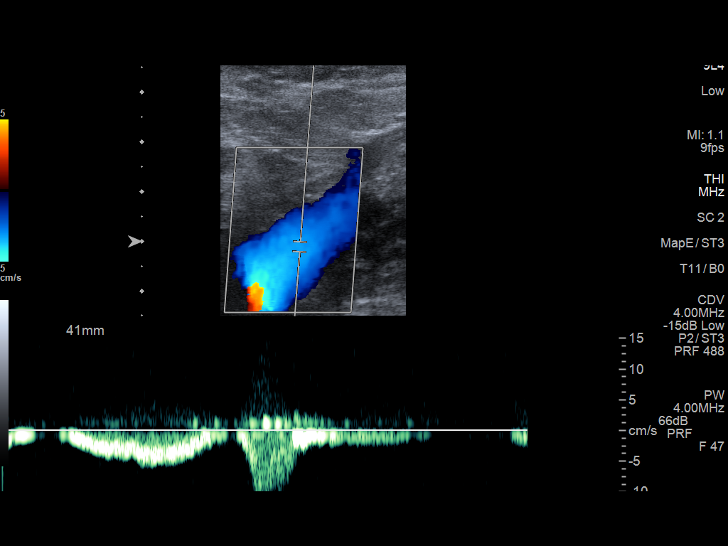

[13 of 24 positions shown; findings below may reference images not displayed]

FINDINGS: VENOUS

Normal compressibility of the common femoral and popliteal veins, as
well as the visualized calf veins.

Duplicated femoral vein system with persistent post thrombotic
change in the mid and distal portion of 1 moiety, good antegrade
flow signal through the other.

VIsualized portions of profunda femoral vein and great saphenous
vein unremarkable. No evidence of acute DVT. Doppler waveforms show
normal direction of venous flow, normal respiratory phasicity and
response to augmentation.

OTHER

Survey views of the contralateral common femoral vein are
unremarkable.

Limitations: none
IMPRESSION: 1. Residual post thrombotic change in 1 moiety of a duplicated
femoral vein system in the mid and distal thigh. No evidence of
acute DVT.

## 2021-02-18 DIAGNOSIS — E785 Hyperlipidemia, unspecified: Secondary | ICD-10-CM | POA: Diagnosis not present

## 2021-02-18 DIAGNOSIS — Z0000 Encounter for general adult medical examination without abnormal findings: Secondary | ICD-10-CM | POA: Diagnosis not present

## 2022-02-26 DIAGNOSIS — N1831 Chronic kidney disease, stage 3a: Secondary | ICD-10-CM | POA: Diagnosis not present

## 2022-02-26 DIAGNOSIS — Z125 Encounter for screening for malignant neoplasm of prostate: Secondary | ICD-10-CM | POA: Diagnosis not present

## 2022-02-26 DIAGNOSIS — E785 Hyperlipidemia, unspecified: Secondary | ICD-10-CM | POA: Diagnosis not present

## 2022-02-26 DIAGNOSIS — R809 Proteinuria, unspecified: Secondary | ICD-10-CM | POA: Diagnosis not present

## 2022-02-26 DIAGNOSIS — Z0000 Encounter for general adult medical examination without abnormal findings: Secondary | ICD-10-CM | POA: Diagnosis not present

## 2022-02-26 DIAGNOSIS — R7309 Other abnormal glucose: Secondary | ICD-10-CM | POA: Diagnosis not present

## 2022-04-02 DIAGNOSIS — L814 Other melanin hyperpigmentation: Secondary | ICD-10-CM | POA: Diagnosis not present

## 2022-04-02 DIAGNOSIS — D2239 Melanocytic nevi of other parts of face: Secondary | ICD-10-CM | POA: Diagnosis not present

## 2022-04-02 DIAGNOSIS — L820 Inflamed seborrheic keratosis: Secondary | ICD-10-CM | POA: Diagnosis not present

## 2022-04-02 DIAGNOSIS — L821 Other seborrheic keratosis: Secondary | ICD-10-CM | POA: Diagnosis not present

## 2022-12-19 DIAGNOSIS — R809 Proteinuria, unspecified: Secondary | ICD-10-CM | POA: Diagnosis not present

## 2022-12-19 DIAGNOSIS — R0602 Shortness of breath: Secondary | ICD-10-CM | POA: Diagnosis not present

## 2022-12-19 DIAGNOSIS — E785 Hyperlipidemia, unspecified: Secondary | ICD-10-CM | POA: Diagnosis not present

## 2022-12-19 DIAGNOSIS — N1831 Chronic kidney disease, stage 3a: Secondary | ICD-10-CM | POA: Diagnosis not present

## 2022-12-19 DIAGNOSIS — Z86718 Personal history of other venous thrombosis and embolism: Secondary | ICD-10-CM | POA: Diagnosis not present

## 2022-12-26 ENCOUNTER — Other Ambulatory Visit: Payer: Self-pay

## 2022-12-26 ENCOUNTER — Encounter (HOSPITAL_BASED_OUTPATIENT_CLINIC_OR_DEPARTMENT_OTHER): Payer: Self-pay

## 2022-12-26 ENCOUNTER — Emergency Department (HOSPITAL_BASED_OUTPATIENT_CLINIC_OR_DEPARTMENT_OTHER): Payer: Federal, State, Local not specified - PPO | Admitting: Radiology

## 2022-12-26 ENCOUNTER — Emergency Department (HOSPITAL_BASED_OUTPATIENT_CLINIC_OR_DEPARTMENT_OTHER)
Admission: EM | Admit: 2022-12-26 | Discharge: 2022-12-26 | Disposition: A | Discharge status: Home / Self Care | Payer: Federal, State, Local not specified - PPO | Attending: Emergency Medicine | Admitting: Emergency Medicine

## 2022-12-26 DIAGNOSIS — S61412A Laceration without foreign body of left hand, initial encounter: Secondary | ICD-10-CM | POA: Diagnosis not present

## 2022-12-26 DIAGNOSIS — S66121A Laceration of flexor muscle, fascia and tendon of left index finger at wrist and hand level, initial encounter: Secondary | ICD-10-CM | POA: Diagnosis not present

## 2022-12-26 DIAGNOSIS — Z23 Encounter for immunization: Secondary | ICD-10-CM | POA: Diagnosis not present

## 2022-12-26 DIAGNOSIS — S61211A Laceration without foreign body of left index finger without damage to nail, initial encounter: Secondary | ICD-10-CM

## 2022-12-26 DIAGNOSIS — S61213A Laceration without foreign body of left middle finger without damage to nail, initial encounter: Secondary | ICD-10-CM | POA: Diagnosis not present

## 2022-12-26 DIAGNOSIS — Z7901 Long term (current) use of anticoagulants: Secondary | ICD-10-CM | POA: Insufficient documentation

## 2022-12-26 DIAGNOSIS — W260XXA Contact with knife, initial encounter: Secondary | ICD-10-CM | POA: Diagnosis not present

## 2022-12-26 DIAGNOSIS — Y92009 Unspecified place in unspecified non-institutional (private) residence as the place of occurrence of the external cause: Secondary | ICD-10-CM | POA: Insufficient documentation

## 2022-12-26 DIAGNOSIS — R55 Syncope and collapse: Secondary | ICD-10-CM | POA: Diagnosis not present

## 2022-12-26 LAB — BASIC METABOLIC PANEL
Anion gap: 10 (ref 5–15)
BUN: 26 mg/dL — ABNORMAL HIGH (ref 8–23)
CO2: 22 mmol/L (ref 22–32)
Calcium: 9.6 mg/dL (ref 8.9–10.3)
Chloride: 104 mmol/L (ref 98–111)
Creatinine, Ser: 1.6 mg/dL — ABNORMAL HIGH (ref 0.61–1.24)
GFR, Estimated: 45 mL/min — ABNORMAL LOW (ref >60)
Glucose, Bld: 121 mg/dL — ABNORMAL HIGH (ref 70–99)
Potassium: 4.1 mmol/L (ref 3.5–5.1)
Sodium: 136 mmol/L (ref 135–145)

## 2022-12-26 LAB — CBC WITH DIFFERENTIAL/PLATELET
Abs Immature Granulocytes: 0.02 10*3/uL (ref 0.00–0.07)
Basophils Absolute: 0.1 10*3/uL (ref 0.0–0.1)
Basophils Relative: 1 %
Eosinophils Absolute: 0.3 10*3/uL (ref 0.0–0.5)
Eosinophils Relative: 3 %
HCT: 45.2 % (ref 39.0–52.0)
Hemoglobin: 15.4 g/dL (ref 13.0–17.0)
Immature Granulocytes: 0 %
Lymphocytes Relative: 39 %
Lymphs Abs: 3.9 10*3/uL (ref 0.7–4.0)
MCH: 31.5 pg (ref 26.0–34.0)
MCHC: 34.1 g/dL (ref 30.0–36.0)
MCV: 92.4 fL (ref 80.0–100.0)
Monocytes Absolute: 0.9 10*3/uL (ref 0.1–1.0)
Monocytes Relative: 9 %
Neutro Abs: 4.8 10*3/uL (ref 1.7–7.7)
Neutrophils Relative %: 48 %
Platelets: 235 10*3/uL (ref 150–400)
RBC: 4.89 MIL/uL (ref 4.22–5.81)
RDW: 13.3 % (ref 11.5–15.5)
WBC: 10.1 10*3/uL (ref 4.0–10.5)
nRBC: 0 % (ref 0.0–0.2)

## 2022-12-26 LAB — CBG MONITORING, ED: Glucose-Capillary: 124 mg/dL — ABNORMAL HIGH (ref 70–99)

## 2022-12-26 LAB — ETHANOL: Alcohol, Ethyl (B): <10 mg/dL (ref <10)

## 2022-12-26 MED ORDER — TETANUS-DIPHTH-ACELL PERTUSSIS 5-2.5-18.5 LF-MCG/0.5 IM SUSY
0.5000 mL | PREFILLED_SYRINGE | Freq: Once | INTRAMUSCULAR | Status: AC
  Administered 2022-12-26: 0.5 mL via INTRAMUSCULAR
  Filled 2022-12-26: qty 0.5

## 2022-12-26 MED ORDER — SODIUM CHLORIDE 0.9 % IV BOLUS
1000.0000 mL | Freq: Once | INTRAVENOUS | Status: AC
  Administered 2022-12-26: 1000 mL via INTRAVENOUS

## 2022-12-26 MED ORDER — LIDOCAINE HCL (PF) 1 % IJ SOLN
10.0000 mg | Freq: Once | INTRAMUSCULAR | Status: AC
  Administered 2022-12-26: 10 mg
  Filled 2022-12-26: qty 5

## 2022-12-26 MED ORDER — CEFAZOLIN SODIUM-DEXTROSE 1-4 GM/50ML-% IV SOLN
1.0000 g | Freq: Once | INTRAVENOUS | Status: AC
  Administered 2022-12-26: 1 g via INTRAVENOUS
  Filled 2022-12-26: qty 50

## 2022-12-26 MED ORDER — LIDOCAINE HCL (PF) 1 % IJ SOLN
INTRAMUSCULAR | Status: AC
  Filled 2022-12-26: qty 5

## 2022-12-26 MED ORDER — CEPHALEXIN 500 MG PO CAPS: 500.0000 mg | ORAL_CAPSULE | Freq: Four times a day (QID) | ORAL | 0 refills | Status: AC

## 2022-12-26 NOTE — Progress Notes (Signed)
Contacted by Dr. Charm Barges regarding this patient who just came off Eliquis, he sustained a laceration to the left index finger with suspected flexor tendon injury and possible nerve injury.  Recommended routine wound care and discharge on oral antibiotics.  Hold Eliquis.  My office will call him Monday to make arrangements for continued care, likely with reevaluation in the office on Tuesday and possible surgery on Wednesday.  Mack Hook, MD Hand Surgery

## 2022-12-26 NOTE — Discharge Instructions (Signed)
You were seen in the emergency department for a laceration to 2 fingers on your left hand and also a syncopal episode.  Your blood count showed your red blood cell to be stable although you are probably dehydrated.  Your index finger laceration appears to have severed a tendon.  This is more than we can repair here in the emergency department.  Dr. Janee Morn hand surgery will be reaching out to you Monday for close follow-up.  Please keep dressing on clean and dry.  Antibiotics sent to pharmacy.  Tylenol for pain.  Hold your Eliquis as you have been doing.  Return if any worsening or concerning symptoms.

## 2022-12-26 NOTE — ED Provider Notes (Signed)
[place] [facility] AT [facility] Provider Note   CSN: [id number] Arrival date & time: 12/26/22  1447     History {Add pertinent medical, surgical, social history, OB history to HPI:1} Chief Complaint  Patient presents with   Extremity Laceration    Left hand    Loss of Consciousness    Cristian Huang is a 72 y.o. male. He is here for evaluation of laceration to left hand just prior to arrival. On eliquis for prior DVT. While in triage had multiple syncopal events. He denies headache, sob, cp, n/v, numbness weakness. No prior hs of syncope.   The history is provided by the patient.  Loss of Consciousness Episode history:  Multiple Most recent episode:  Today Progression:  Unchanged Chronicity:  New Context comment:  Injury Witnessed: yes   Relieved by:  Lying down Worsened by:  Nothing Ineffective treatments:  None tried Associated symptoms: diaphoresis and dizziness   Associated symptoms: no chest pain, no fever, no headaches, no nausea, no shortness of breath and no vomiting        Home Medications Prior to Admission medications   Medication Sig Start Date End Date Taking? Authorizing Provider  apixaban (ELIQUIS) 5 MG TABS tablet Take 2 tablets (10 mg total) by mouth 2 (two) times daily for 3 days. And then transition to 5 mg twice daily 10/02/19 12/26/22 Yes Zigmund Daniel., MD  apixaban (ELIQUIS) 5 MG TABS tablet Take 1 tablet (5 mg total) by mouth 2 (two) times daily. 10/05/19 11/04/19  Zigmund Daniel., MD  lisinopril (ZESTRIL) 10 MG tablet Take 10 mg by mouth daily. 09/25/19   [provider]  memantine (NAMENDA) 5 MG tablet Take 5 mg by mouth 2 (two) times daily.  09/26/19   [provider]  sertraline (ZOLOFT) 100 MG tablet Take 100 mg by mouth daily. 09/25/19   [provider]  traZODone (DESYREL) 50 MG tablet Take 50 mg by mouth at bedtime.  09/26/19   [provider]      Allergies     Patient has no known allergies.    Review of Systems   Review of Systems  Constitutional:  Positive for diaphoresis. Negative for fever.  Eyes:  Negative for visual disturbance.  Respiratory:  Negative for shortness of breath.   Cardiovascular:  Positive for syncope. Negative for chest pain.  Gastrointestinal:  Negative for nausea and vomiting.  Skin:  Positive for wound.  Neurological:  Positive for dizziness. Negative for headaches.    Physical Exam Updated Vital Signs BP 108/78 (BP Location: Right Arm)   Pulse 67   Temp 97.6 F (36.4 C) (Oral)   Resp 14   Ht 5\' 9"  (1.753 m)   Wt 95.3 kg   SpO2 99%   BMI 31.01 kg/m  Physical Exam Vitals and nursing note reviewed.  Constitutional:      General: He is not in acute distress.    Appearance: Normal appearance. He is well-developed. He is diaphoretic.  HENT:     Head: Normocephalic and atraumatic.  Eyes:     Conjunctiva/sclera: Conjunctivae normal.  Cardiovascular:     Rate and Rhythm: Normal rate and regular rhythm.     Heart sounds: No murmur heard. Pulmonary:     Effort: Pulmonary effort is normal. No respiratory distress.     Breath sounds: Normal breath sounds.  Abdominal:     Palpations: Abdomen is soft.     Tenderness: There is no abdominal  tenderness. There is no guarding or rebound.  Musculoskeletal:        General: Signs of injury present. Normal range of motion.     Cervical back: Neck supple.     Right lower leg: Edema present.     Left lower leg: Edema present.     Comments: Left hand.  Left middle finger approximately 1 cm laceration on the lateral aspect of his proximal phalanx.  FDS FDP and extensor are intact.  Normal sensation.  Left index finger approximately 2 cm laceration at the PIP.  He has decreased sensation of the lateral aspect of the distal digit and he has no flexion appreciated.  EDC intact.  Nail unaffected.  Radial pulse 2+.  No other injuries appreciated.  Skin:    General: Skin is  warm.     Capillary Refill: Capillary refill takes less than 2 seconds.  Neurological:     General: No focal deficit present.     Mental Status: He is alert.     Cranial Nerves: No cranial nerve deficit.     Sensory: No sensory deficit.     Motor: No weakness.     ED Results / Procedures / Treatments   Labs (all labs ordered are listed, but only abnormal results are displayed) Labs Reviewed - No data to display  EKG None  Radiology No results found.  Procedures .Marland KitchenLaceration Repair  Date/Time: 12/26/2022 5:20 PM  Performed by: Terrilee Files, MD Authorized by: Terrilee Files, MD   Consent:    Consent obtained:  Verbal   Consent given by:  Patient   Risks, benefits, and alternatives were discussed: yes     Risks discussed:  Infection, nerve damage, poor wound healing, pain, retained foreign body, tendon damage and vascular damage   Alternatives discussed:  No treatment, delayed treatment and referral Universal protocol:    Procedure explained and questions answered to patient or proxy's satisfaction: yes     Patient identity confirmed:  Verbally with patient Anesthesia:    Anesthesia method:  Nerve block   Block anesthetic:  Lidocaine 1% w/o epi   Block injection procedure:  Anatomic landmarks identified and negative aspiration for blood   Block outcome:  Anesthesia achieved Laceration details:    Location:  Finger   Finger location:  L long finger   Length (cm):  1 Pre-procedure details:    Preparation:  Patient was prepped and draped in usual sterile fashion Treatment:    Area cleansed with:  Saline   Amount of cleaning:  Standard   Irrigation solution:  Sterile saline Skin repair:    Repair method:  Sutures   Suture size:  5-0   Suture material:  Nylon   Suture technique:  Simple interrupted   Number of sutures:  2 Approximation:    Approximation:  Close .Marland KitchenLaceration Repair  Date/Time: 12/26/2022 5:21 PM  Performed by: Terrilee Files,  MD Authorized by: Terrilee Files, MD   Consent:    Consent obtained:  Verbal   Consent given by:  Patient   Risks, benefits, and alternatives were discussed: yes     Risks discussed:  Infection, nerve damage, poor wound healing, pain, retained foreign body, tendon damage and vascular damage   Alternatives discussed:  No treatment, delayed treatment and referral Universal protocol:    Procedure explained and questions answered to patient or proxy's satisfaction: yes     Patient identity confirmed:  Verbally with patient Anesthesia:    Anesthesia method:  Nerve block   Block anesthetic:  Lidocaine 1% w/o epi   Block injection procedure:  Anatomic landmarks identified and negative aspiration for blood   Block outcome:  Anesthesia achieved Laceration details:    Location:  Finger   Finger location:  L index finger   Length (cm):  4 Pre-procedure details:    Preparation:  Patient was prepped and draped in usual sterile fashion Exploration:    Wound extent: tendon damage     Tendon damage location:  Upper extremity   Upper extremity tendon damage location:  Finger flexor   Finger flexor tendon:  Flexor digitorum superficialis and flexor digitorum profundus   Tendon damage extent:  Complete transection   Tendon repair plan:  Refer for evaluation   Contaminated: no   Treatment:    Area cleansed with:  Saline   Amount of cleaning:  Standard   Irrigation solution:  Sterile saline Skin repair:    Repair method:  Sutures   Suture size:  5-0   Suture material:  Nylon   Suture technique:  Simple interrupted   Number of sutures:  4 Approximation:    Approximation:  Close Repair type:    Repair type:  Intermediate Post-procedure details:    Dressing:  Bulky dressing   Procedure completion:  Tolerated well, no immediate complications   {Document cardiac monitor, telemetry assessment procedure when appropriate:1}  Medications Ordered in ED Medications  lidocaine (PF) (XYLOCAINE)  1 % injection (has no administration in time range)  sodium chloride 0.9 % bolus 1,000 mL (0 mLs Intravenous Stopped 12/26/22 1703)  ceFAZolin (ANCEF) IVPB 1 g/50 mL premix (0 g Intravenous Stopped 12/26/22 1659)  Tdap (BOOSTRIX) injection 0.5 mL (0.5 mLs Intramuscular Given 12/26/22 1640)  lidocaine (PF) (XYLOCAINE) 1 % injection 10 mg (10 mg Infiltration Given 12/26/22 1649)    ED Course/ Medical Decision Making/ A&P Clinical Course as of 12/26/22 1720  Fri Dec 26, 2022  1646 Discussed with Dr. Janee Morn hand surgery.  He is recommending closure of skin and dressing.  Hold Eliquis.  Office will call him Monday for likely evaluation Tuesday and operative repair on Wednesday. [MB]  1648 X-ray left hand interpreted by me as no acute fracture.  Awaiting radiology reading. [MB]    Clinical Course User Index [MB] Terrilee Files, MD   {   Click here for ABCD2, HEART and other calculatorsREFRESH Note before signing :1}                          Medical Decision Making Amount and/or Complexity of Data Reviewed Labs: ordered. Radiology: ordered.  Risk Prescription drug management.   This patient complains of ***; this involves an extensive number of treatment Options and is a complaint that carries with it a high risk of complications and morbidity. The differential includes ***  I ordered, reviewed and interpreted labs, which included *** I ordered medication *** and reviewed PMP when indicated. I ordered imaging studies which included *** and I independently    visualized and interpreted imaging which showed *** Additional history obtained from *** Previous records obtained and reviewed *** I consulted *** and discussed lab and imaging findings and discussed disposition.  Cardiac monitoring reviewed, *** Social determinants considered, *** Critical Interventions: ***  After the interventions stated above, I reevaluated the patient and found *** Admission and further testing  considered, ***   {Document critical care time when appropriate:1} {Document review of labs and clinical decision  tools ie heart score, Chads2Vasc2 etc:1}  {Document your independent review of radiology images, and any outside records:1} {Document your discussion with family members, caretakers, and with consultants:1} {Document social determinants of health affecting pt's care:1} {Document your decision making why or why not admission, treatments were needed:1} Final Clinical Impression(s) / ED Diagnoses Final diagnoses:  None    Rx / DC Orders ED Discharge Orders     None

## 2022-12-26 NOTE — ED Triage Notes (Addendum)
Patient noted to have 2 syncopal episodes at this time while in triage. Patient placed in room 5 with assistance from ED staff. Dr. Charm Barges to bedside and IV placed.

## 2022-12-26 NOTE — ED Triage Notes (Addendum)
Patient arrives with complaints of cutting in left hand with a dull knife while at home. Patient states that he was trying to cut something and cut his hand by mistake. Unsure of tetanus status. He does take eliquis

## 2022-12-30 DIAGNOSIS — S61213A Laceration without foreign body of left middle finger without damage to nail, initial encounter: Secondary | ICD-10-CM | POA: Diagnosis not present

## 2022-12-30 DIAGNOSIS — S61211A Laceration without foreign body of left index finger without damage to nail, initial encounter: Secondary | ICD-10-CM | POA: Diagnosis not present

## 2022-12-31 DIAGNOSIS — Y999 Unspecified external cause status: Secondary | ICD-10-CM | POA: Diagnosis not present

## 2022-12-31 DIAGNOSIS — S64493A Injury of digital nerve of left middle finger, initial encounter: Secondary | ICD-10-CM | POA: Diagnosis not present

## 2022-12-31 DIAGNOSIS — S66121A Laceration of flexor muscle, fascia and tendon of left index finger at wrist and hand level, initial encounter: Secondary | ICD-10-CM | POA: Diagnosis not present

## 2022-12-31 DIAGNOSIS — S66123A Laceration of flexor muscle, fascia and tendon of left middle finger at wrist and hand level, initial encounter: Secondary | ICD-10-CM | POA: Diagnosis not present

## 2022-12-31 DIAGNOSIS — X58XXXA Exposure to other specified factors, initial encounter: Secondary | ICD-10-CM | POA: Diagnosis not present

## 2022-12-31 DIAGNOSIS — S64491A Injury of digital nerve of left index finger, initial encounter: Secondary | ICD-10-CM | POA: Diagnosis not present

## 2023-01-06 DIAGNOSIS — M25642 Stiffness of left hand, not elsewhere classified: Secondary | ICD-10-CM | POA: Diagnosis not present

## 2023-01-06 DIAGNOSIS — M79642 Pain in left hand: Secondary | ICD-10-CM | POA: Diagnosis not present

## 2023-01-06 DIAGNOSIS — Z9889 Other specified postprocedural states: Secondary | ICD-10-CM | POA: Diagnosis not present

## 2023-01-06 DIAGNOSIS — Z4889 Encounter for other specified surgical aftercare: Secondary | ICD-10-CM | POA: Diagnosis not present

## 2023-01-13 DIAGNOSIS — Z9889 Other specified postprocedural states: Secondary | ICD-10-CM | POA: Diagnosis not present

## 2023-01-13 DIAGNOSIS — M79642 Pain in left hand: Secondary | ICD-10-CM | POA: Diagnosis not present

## 2023-01-13 DIAGNOSIS — Z4889 Encounter for other specified surgical aftercare: Secondary | ICD-10-CM | POA: Diagnosis not present

## 2023-01-13 DIAGNOSIS — M25642 Stiffness of left hand, not elsewhere classified: Secondary | ICD-10-CM | POA: Diagnosis not present

## 2023-01-21 DIAGNOSIS — M79642 Pain in left hand: Secondary | ICD-10-CM | POA: Diagnosis not present

## 2023-01-21 DIAGNOSIS — Z4889 Encounter for other specified surgical aftercare: Secondary | ICD-10-CM | POA: Diagnosis not present

## 2023-01-21 DIAGNOSIS — Z9889 Other specified postprocedural states: Secondary | ICD-10-CM | POA: Diagnosis not present

## 2023-01-21 DIAGNOSIS — M25642 Stiffness of left hand, not elsewhere classified: Secondary | ICD-10-CM | POA: Diagnosis not present

## 2023-01-28 DIAGNOSIS — Z9889 Other specified postprocedural states: Secondary | ICD-10-CM | POA: Diagnosis not present

## 2023-01-28 DIAGNOSIS — M79642 Pain in left hand: Secondary | ICD-10-CM | POA: Diagnosis not present

## 2023-01-28 DIAGNOSIS — Z4889 Encounter for other specified surgical aftercare: Secondary | ICD-10-CM | POA: Diagnosis not present

## 2023-01-28 DIAGNOSIS — M25642 Stiffness of left hand, not elsewhere classified: Secondary | ICD-10-CM | POA: Diagnosis not present

## 2023-02-04 DIAGNOSIS — M25642 Stiffness of left hand, not elsewhere classified: Secondary | ICD-10-CM | POA: Diagnosis not present

## 2023-02-04 DIAGNOSIS — M79642 Pain in left hand: Secondary | ICD-10-CM | POA: Diagnosis not present

## 2023-02-04 DIAGNOSIS — Z9889 Other specified postprocedural states: Secondary | ICD-10-CM | POA: Diagnosis not present

## 2023-02-04 DIAGNOSIS — Z4889 Encounter for other specified surgical aftercare: Secondary | ICD-10-CM | POA: Diagnosis not present

## 2023-02-11 DIAGNOSIS — M25642 Stiffness of left hand, not elsewhere classified: Secondary | ICD-10-CM | POA: Diagnosis not present

## 2023-02-11 DIAGNOSIS — M79642 Pain in left hand: Secondary | ICD-10-CM | POA: Diagnosis not present

## 2023-02-11 DIAGNOSIS — Z9889 Other specified postprocedural states: Secondary | ICD-10-CM | POA: Diagnosis not present

## 2023-02-12 DIAGNOSIS — S61213A Laceration without foreign body of left middle finger without damage to nail, initial encounter: Secondary | ICD-10-CM | POA: Diagnosis not present

## 2023-02-12 DIAGNOSIS — S61211D Laceration without foreign body of left index finger without damage to nail, subsequent encounter: Secondary | ICD-10-CM | POA: Diagnosis not present

## 2023-02-18 DIAGNOSIS — Z4889 Encounter for other specified surgical aftercare: Secondary | ICD-10-CM | POA: Diagnosis not present

## 2023-02-18 DIAGNOSIS — M25642 Stiffness of left hand, not elsewhere classified: Secondary | ICD-10-CM | POA: Diagnosis not present

## 2023-02-18 DIAGNOSIS — Z9889 Other specified postprocedural states: Secondary | ICD-10-CM | POA: Diagnosis not present

## 2023-02-18 DIAGNOSIS — M79642 Pain in left hand: Secondary | ICD-10-CM | POA: Diagnosis not present

## 2023-02-25 DIAGNOSIS — Z9889 Other specified postprocedural states: Secondary | ICD-10-CM | POA: Diagnosis not present

## 2023-02-25 DIAGNOSIS — M25642 Stiffness of left hand, not elsewhere classified: Secondary | ICD-10-CM | POA: Diagnosis not present

## 2023-02-25 DIAGNOSIS — M79642 Pain in left hand: Secondary | ICD-10-CM | POA: Diagnosis not present

## 2023-02-25 DIAGNOSIS — Z4889 Encounter for other specified surgical aftercare: Secondary | ICD-10-CM | POA: Diagnosis not present

## 2023-03-04 DIAGNOSIS — Z9889 Other specified postprocedural states: Secondary | ICD-10-CM | POA: Diagnosis not present

## 2023-03-04 DIAGNOSIS — Z4889 Encounter for other specified surgical aftercare: Secondary | ICD-10-CM | POA: Diagnosis not present

## 2023-03-04 DIAGNOSIS — M79642 Pain in left hand: Secondary | ICD-10-CM | POA: Diagnosis not present

## 2023-03-04 DIAGNOSIS — M25642 Stiffness of left hand, not elsewhere classified: Secondary | ICD-10-CM | POA: Diagnosis not present

## 2023-03-11 DIAGNOSIS — Z9889 Other specified postprocedural states: Secondary | ICD-10-CM | POA: Diagnosis not present

## 2023-03-11 DIAGNOSIS — M25642 Stiffness of left hand, not elsewhere classified: Secondary | ICD-10-CM | POA: Diagnosis not present

## 2023-03-11 DIAGNOSIS — M79642 Pain in left hand: Secondary | ICD-10-CM | POA: Diagnosis not present

## 2023-03-17 DIAGNOSIS — S61213D Laceration without foreign body of left middle finger without damage to nail, subsequent encounter: Secondary | ICD-10-CM | POA: Diagnosis not present

## 2023-03-17 DIAGNOSIS — S61211D Laceration without foreign body of left index finger without damage to nail, subsequent encounter: Secondary | ICD-10-CM | POA: Diagnosis not present

## 2023-03-18 DIAGNOSIS — Z0000 Encounter for general adult medical examination without abnormal findings: Secondary | ICD-10-CM | POA: Diagnosis not present

## 2023-03-18 DIAGNOSIS — Z9889 Other specified postprocedural states: Secondary | ICD-10-CM | POA: Diagnosis not present

## 2023-03-18 DIAGNOSIS — M25642 Stiffness of left hand, not elsewhere classified: Secondary | ICD-10-CM | POA: Diagnosis not present

## 2023-03-18 DIAGNOSIS — Z4889 Encounter for other specified surgical aftercare: Secondary | ICD-10-CM | POA: Diagnosis not present

## 2023-03-18 DIAGNOSIS — M79642 Pain in left hand: Secondary | ICD-10-CM | POA: Diagnosis not present

## 2023-03-19 DIAGNOSIS — N1831 Chronic kidney disease, stage 3a: Secondary | ICD-10-CM | POA: Diagnosis not present

## 2023-03-25 DIAGNOSIS — M79642 Pain in left hand: Secondary | ICD-10-CM | POA: Diagnosis not present

## 2023-03-25 DIAGNOSIS — Z9889 Other specified postprocedural states: Secondary | ICD-10-CM | POA: Diagnosis not present

## 2023-03-25 DIAGNOSIS — M25642 Stiffness of left hand, not elsewhere classified: Secondary | ICD-10-CM | POA: Diagnosis not present

## 2023-03-25 DIAGNOSIS — Z4889 Encounter for other specified surgical aftercare: Secondary | ICD-10-CM | POA: Diagnosis not present

## 2023-04-01 DIAGNOSIS — M25642 Stiffness of left hand, not elsewhere classified: Secondary | ICD-10-CM | POA: Diagnosis not present

## 2023-04-01 DIAGNOSIS — Z4889 Encounter for other specified surgical aftercare: Secondary | ICD-10-CM | POA: Diagnosis not present

## 2023-04-01 DIAGNOSIS — Z9889 Other specified postprocedural states: Secondary | ICD-10-CM | POA: Diagnosis not present

## 2023-04-01 DIAGNOSIS — M79642 Pain in left hand: Secondary | ICD-10-CM | POA: Diagnosis not present

## 2023-04-08 DIAGNOSIS — M25642 Stiffness of left hand, not elsewhere classified: Secondary | ICD-10-CM | POA: Diagnosis not present

## 2023-04-08 DIAGNOSIS — M79642 Pain in left hand: Secondary | ICD-10-CM | POA: Diagnosis not present

## 2023-04-08 DIAGNOSIS — Z4889 Encounter for other specified surgical aftercare: Secondary | ICD-10-CM | POA: Diagnosis not present

## 2023-04-08 DIAGNOSIS — Z9889 Other specified postprocedural states: Secondary | ICD-10-CM | POA: Diagnosis not present

## 2023-04-15 DIAGNOSIS — M79642 Pain in left hand: Secondary | ICD-10-CM | POA: Diagnosis not present

## 2023-04-15 DIAGNOSIS — Z9889 Other specified postprocedural states: Secondary | ICD-10-CM | POA: Diagnosis not present

## 2023-04-15 DIAGNOSIS — Z4889 Encounter for other specified surgical aftercare: Secondary | ICD-10-CM | POA: Diagnosis not present

## 2023-04-15 DIAGNOSIS — M25642 Stiffness of left hand, not elsewhere classified: Secondary | ICD-10-CM | POA: Diagnosis not present

## 2023-04-30 DIAGNOSIS — S61213A Laceration without foreign body of left middle finger without damage to nail, initial encounter: Secondary | ICD-10-CM | POA: Diagnosis not present

## 2023-04-30 DIAGNOSIS — S61211D Laceration without foreign body of left index finger without damage to nail, subsequent encounter: Secondary | ICD-10-CM | POA: Diagnosis not present

## 2023-05-06 DIAGNOSIS — M25642 Stiffness of left hand, not elsewhere classified: Secondary | ICD-10-CM | POA: Diagnosis not present

## 2023-05-06 DIAGNOSIS — Z9889 Other specified postprocedural states: Secondary | ICD-10-CM | POA: Diagnosis not present

## 2023-05-06 DIAGNOSIS — M79642 Pain in left hand: Secondary | ICD-10-CM | POA: Diagnosis not present

## 2023-05-06 DIAGNOSIS — Z4889 Encounter for other specified surgical aftercare: Secondary | ICD-10-CM | POA: Diagnosis not present

## 2023-05-20 DIAGNOSIS — M79642 Pain in left hand: Secondary | ICD-10-CM | POA: Diagnosis not present

## 2023-05-20 DIAGNOSIS — Z9889 Other specified postprocedural states: Secondary | ICD-10-CM | POA: Diagnosis not present

## 2023-05-20 DIAGNOSIS — Z4889 Encounter for other specified surgical aftercare: Secondary | ICD-10-CM | POA: Diagnosis not present

## 2023-05-20 DIAGNOSIS — M25642 Stiffness of left hand, not elsewhere classified: Secondary | ICD-10-CM | POA: Diagnosis not present

## 2023-05-27 DIAGNOSIS — M25642 Stiffness of left hand, not elsewhere classified: Secondary | ICD-10-CM | POA: Diagnosis not present

## 2023-05-27 DIAGNOSIS — Z9889 Other specified postprocedural states: Secondary | ICD-10-CM | POA: Diagnosis not present

## 2023-05-27 DIAGNOSIS — Z4889 Encounter for other specified surgical aftercare: Secondary | ICD-10-CM | POA: Diagnosis not present

## 2023-05-27 DIAGNOSIS — M79642 Pain in left hand: Secondary | ICD-10-CM | POA: Diagnosis not present

## 2023-06-03 DIAGNOSIS — M79642 Pain in left hand: Secondary | ICD-10-CM | POA: Diagnosis not present

## 2023-06-03 DIAGNOSIS — Z4889 Encounter for other specified surgical aftercare: Secondary | ICD-10-CM | POA: Diagnosis not present

## 2023-06-03 DIAGNOSIS — M25642 Stiffness of left hand, not elsewhere classified: Secondary | ICD-10-CM | POA: Diagnosis not present

## 2023-06-03 DIAGNOSIS — Z9889 Other specified postprocedural states: Secondary | ICD-10-CM | POA: Diagnosis not present

## 2023-06-15 DIAGNOSIS — Z9889 Other specified postprocedural states: Secondary | ICD-10-CM | POA: Diagnosis not present

## 2023-06-15 DIAGNOSIS — Z4889 Encounter for other specified surgical aftercare: Secondary | ICD-10-CM | POA: Diagnosis not present

## 2023-06-15 DIAGNOSIS — M79642 Pain in left hand: Secondary | ICD-10-CM | POA: Diagnosis not present

## 2023-06-15 DIAGNOSIS — M25642 Stiffness of left hand, not elsewhere classified: Secondary | ICD-10-CM | POA: Diagnosis not present

## 2023-06-24 DIAGNOSIS — M25642 Stiffness of left hand, not elsewhere classified: Secondary | ICD-10-CM | POA: Diagnosis not present

## 2023-06-24 DIAGNOSIS — Z9889 Other specified postprocedural states: Secondary | ICD-10-CM | POA: Diagnosis not present

## 2023-06-24 DIAGNOSIS — M79642 Pain in left hand: Secondary | ICD-10-CM | POA: Diagnosis not present

## 2023-07-15 DIAGNOSIS — M25642 Stiffness of left hand, not elsewhere classified: Secondary | ICD-10-CM | POA: Diagnosis not present

## 2023-07-15 DIAGNOSIS — M79642 Pain in left hand: Secondary | ICD-10-CM | POA: Diagnosis not present

## 2023-07-15 DIAGNOSIS — Z9889 Other specified postprocedural states: Secondary | ICD-10-CM | POA: Diagnosis not present

## 2023-07-22 DIAGNOSIS — M79642 Pain in left hand: Secondary | ICD-10-CM | POA: Diagnosis not present

## 2023-07-22 DIAGNOSIS — M25642 Stiffness of left hand, not elsewhere classified: Secondary | ICD-10-CM | POA: Diagnosis not present

## 2023-07-22 DIAGNOSIS — Z9889 Other specified postprocedural states: Secondary | ICD-10-CM | POA: Diagnosis not present

## 2023-08-04 DIAGNOSIS — S61211D Laceration without foreign body of left index finger without damage to nail, subsequent encounter: Secondary | ICD-10-CM | POA: Diagnosis not present

## 2023-08-04 DIAGNOSIS — S63213A Subluxation of metacarpophalangeal joint of left middle finger, initial encounter: Secondary | ICD-10-CM | POA: Diagnosis not present

## 2023-08-05 DIAGNOSIS — M79642 Pain in left hand: Secondary | ICD-10-CM | POA: Diagnosis not present

## 2023-08-05 DIAGNOSIS — Z4889 Encounter for other specified surgical aftercare: Secondary | ICD-10-CM | POA: Diagnosis not present

## 2023-08-05 DIAGNOSIS — M25642 Stiffness of left hand, not elsewhere classified: Secondary | ICD-10-CM | POA: Diagnosis not present

## 2023-08-05 DIAGNOSIS — Z9889 Other specified postprocedural states: Secondary | ICD-10-CM | POA: Diagnosis not present

## 2024-03-21 DIAGNOSIS — Z1389 Encounter for screening for other disorder: Secondary | ICD-10-CM | POA: Diagnosis not present

## 2024-03-21 DIAGNOSIS — Z0000 Encounter for general adult medical examination without abnormal findings: Secondary | ICD-10-CM | POA: Diagnosis not present

## 2024-03-21 DIAGNOSIS — E785 Hyperlipidemia, unspecified: Secondary | ICD-10-CM | POA: Diagnosis not present

## 2024-03-21 DIAGNOSIS — Z125 Encounter for screening for malignant neoplasm of prostate: Secondary | ICD-10-CM | POA: Diagnosis not present

## 2024-03-21 DIAGNOSIS — N1831 Chronic kidney disease, stage 3a: Secondary | ICD-10-CM | POA: Diagnosis not present

## 2024-03-21 DIAGNOSIS — R809 Proteinuria, unspecified: Secondary | ICD-10-CM | POA: Diagnosis not present

## 2024-03-21 DIAGNOSIS — Z23 Encounter for immunization: Secondary | ICD-10-CM | POA: Diagnosis not present
